# Patient Record
Sex: Female | Born: 1950 | Race: Black or African American | Hispanic: No | Smoking: Never smoker
Health system: Southern US, Community
[De-identification: ages and names within clinical notes are randomized; demographics above are authoritative.]

## PROBLEM LIST (undated history)

## (undated) DIAGNOSIS — M6740 Ganglion, unspecified site: Secondary | ICD-10-CM

## (undated) DIAGNOSIS — T7840XA Allergy, unspecified, initial encounter: Secondary | ICD-10-CM

## (undated) DIAGNOSIS — E559 Vitamin D deficiency, unspecified: Secondary | ICD-10-CM

## (undated) HISTORY — DX: Ganglion, unspecified site: M67.40

## (undated) HISTORY — PX: PARTIAL HYSTERECTOMY: SHX80

## (undated) HISTORY — DX: Allergy, unspecified, initial encounter: T78.40XA

## (undated) HISTORY — PX: APPENDECTOMY: SHX54

## (undated) HISTORY — DX: Vitamin D deficiency, unspecified: E55.9

---

## 2021-11-29 LAB — TSH: TSH: 0.01 — AB (ref 0.41–5.90)

## 2021-12-12 LAB — COLOGUARD: COLOGUARD: NEGATIVE

## 2022-01-04 ENCOUNTER — Encounter: Payer: Self-pay | Admitting: Nurse Practitioner

## 2022-01-04 ENCOUNTER — Ambulatory Visit: Payer: Medicare PPO | Admitting: Nurse Practitioner

## 2022-01-04 VITALS — BP 119/78 | HR 90 | Ht 64.0 in | Wt 173.0 lb

## 2022-01-04 DIAGNOSIS — E0590 Thyrotoxicosis, unspecified without thyrotoxic crisis or storm: Secondary | ICD-10-CM

## 2022-01-04 DIAGNOSIS — R7989 Other specified abnormal findings of blood chemistry: Secondary | ICD-10-CM | POA: Diagnosis not present

## 2022-01-04 NOTE — Progress Notes (Signed)
? ? ? 01/04/2022   ? ? ?Endocrinology Consult Note  ? ? ?Subjective:  ? ? Patient ID: Gwendolyn Gordon, female    DOB: 1951/07/03, PCP Lenoria ChimeWhite, Valerie A, FNP. ? ? ?Past Medical History:  ?Diagnosis Date  ? Allergies   ? Ganglion cyst   ? Vitamin D deficiency   ? ? ?Past Surgical History:  ?Procedure Laterality Date  ? APPENDECTOMY    ? PARTIAL HYSTERECTOMY    ? ? ?Social History  ? ?Socioeconomic History  ? Marital status: Widowed  ?  Spouse name: Not on file  ? Number of children: Not on file  ? Years of education: Not on file  ? Highest education level: Not on file  ?Occupational History  ? Not on file  ?Tobacco Use  ? Smoking status: Never  ? Smokeless tobacco: Never  ?Vaping Use  ? Vaping Use: Never used  ?Substance and Sexual Activity  ? Alcohol use: Never  ? Drug use: Never  ? Sexual activity: Not on file  ?Other Topics Concern  ? Not on file  ?Social History Narrative  ? Not on file  ? ?Social Determinants of Health  ? ?Financial Resource Strain: Not on file  ?Food Insecurity: Not on file  ?Transportation Needs: Not on file  ?Physical Activity: Not on file  ?Stress: Not on file  ?Social Connections: Not on file  ? ? ?Family History  ?Problem Relation Age of Onset  ? Diabetes Mother   ? ? ?Outpatient Encounter Medications as of 01/04/2022  ?Medication Sig  ? cetirizine (ZYRTEC ALLERGY) 10 MG tablet daily as needed.  ? Cholecalciferol 50 MCG (2000 UT) CAPS daily.  ? ?No facility-administered encounter medications on file as of 01/04/2022.  ? ? ?ALLERGIES: ?No Known Allergies ? ?VACCINATION STATUS: ?Immunization History  ?Administered Date(s) Administered  ? Moderna Sars-Covid-2 Vaccination 10/12/2019, 11/09/2019  ? PFIZER(Purple Top)SARS-COV-2 Vaccination 08/05/2020  ? Research officer, trade unionfizer Covid-19 Vaccine Bivalent Booster 5932yrs & up 06/08/2021  ? ? ? ?HPI ? ?Gwendolyn ModyCarolyn M Ernest is 71 y.o. female who presents today with a medical history as above. she is being seen in consultation for hyperthyroidism requested by Lenoria ChimeWhite, Valerie  A, FNP.  She had her annual physical and thyroid labs were drawn and came back abnormal.  She does not report any specific symptoms of thyroid dysfunction other than difficulty losing weight, hair loss, and muscle aches.  her most recent thyroid labs revealed suppressed TSH of < 0.01 and normal Free T4 of 1.41 on 11/29/21.  She has never had any imaging of her thyroid in the past. ? ?she denies dysphagia, choking, shortness of breath, no recent voice change.  ?  ?she does family history of thyroid dysfunction in her daughter-hyperthyroidism, but denies family hx of thyroid cancer. she denies personal history of goiter. she is not on any anti-thyroid medications nor on any thyroid hormone supplements. Denies use of Biotin containing supplements.  she is willing to proceed with appropriate work up and therapy for thyrotoxicosis. ? ? ?Review of systems ? ?Constitutional: + Minimally fluctuating body weight, current Body mass index is 29.7 kg/m?., + fatigue, no subjective hyperthermia, no subjective hypothermia ?Eyes: no blurry vision, no xerophthalmia ?ENT: no sore throat, no nodules palpated in throat, no dysphagia/odynophagia, no hoarseness ?Cardiovascular: no chest pain, no shortness of breath, no palpitations, no leg swelling ?Respiratory: no cough, no shortness of breath ?Gastrointestinal: no nausea/vomiting/diarrhea ?Musculoskeletal: diffuse muscle/joint aches ?Skin: no rashes, no hyperemia, mild hair loss ?Neurological: no tremors, no numbness, no  tingling, no dizziness ?Psychiatric: no depression, no anxiety ? ? ?Objective:  ?  ?BP 119/78   Pulse 90   Ht 5\' 4"  (1.626 m)   Wt 173 lb (78.5 kg)   BMI 29.70 kg/m?   ?Wt Readings from Last 3 Encounters:  ?01/04/22 173 lb (78.5 kg)  ?  ? ?BP Readings from Last 3 Encounters:  ?01/04/22 119/78  ? ? ?                     ? ?Physical Exam- Limited ? ?Constitutional:  Body mass index is 29.7 kg/m?. , not in acute distress, normal state of mind ?Eyes:  EOMI, no  exophthalmos ?Neck: Supple ?Thyroid: Mild gross goiter, no palpable nodularity ?Cardiovascular: RRR, no murmurs, rubs, or gallops, no edema ?Respiratory: Adequate breathing efforts, no crackles, rales, rhonchi, or wheezing ?Musculoskeletal: no gross deformities, strength intact in all four extremities, no gross restriction of joint movements ?Skin:  no rashes, no hyperemia ?Neurological: no tremor with outstretched hands ? ? ?CMP  ?No results found for: NA, K, CL, CO2, GLUCOSE, BUN, CREATININE, CALCIUM, PROT, ALBUMIN, AST, ALT, ALKPHOS, BILITOT, GFRNONAA, GFRAA ? ? ?CBC ?No results found for: WBC, RBC, HGB, HCT, PLT, MCV, MCH, MCHC, RDW, LYMPHSABS, MONOABS, EOSABS, BASOSABS ? ? ?Diabetic Labs (most recent): ?No results found for: HGBA1C ? ?Lipid Panel  ?No results found for: CHOL, TRIG, HDL, CHOLHDL, VLDL, LDLCALC, LDLDIRECT, LABVLDL ? ? ?Lab Results  ?Component Value Date  ? TSH 0.01 (A) 11/29/2021  ?  ? ?TSH ?TSH ?Resulted: 11/29/21 0000  ?Result status: Final  ?Resulting lab: OTHER  ?Reference range: 0.41 - 5.90  ?Value: 0.01 Abnormal    ?Comment: TSH < 0.01; Free T4-1.41  ? ? ? ?Assessment & Plan:  ? ?1) Abnormal TSH ? ?she is being seen at a kind request of White, 07-21-1994, FNP. ? ?her history and most recent labs are reviewed, and she was examined clinically. Subjective and objective findings are inconsistent with thyrotoxicosis from primary hyperthyroidism. However, more information is needed to help identify any disorder.  The potential risks of untreated thyrotoxicosis and the need for definitive therapy have been discussed in detail with her, and she agrees to proceed with diagnostic workup and treatment plan. ?  ?I will repeat full profile thyroid function tests today, including antibody testing to assess for autoimmune cause.  Will also order thyroid ultrasound given mild goiter to assess thyroid anatomy.  ? ?Given essentially normal exam, not overly concerned with hyperthyroidism at this time,  therefore will hold off on uptake and scan.  I did not initiate any new prescriptions at today's visit. ?  ? ?she will return in 2 weeks for treatment decision. ? ? ? ? ?-Patient is advised to maintain close follow up with Raenette Rover, FNP for primary care needs. ? ? ?- Time spent with the patient: 45 minutes, of which >50% was spent in obtaining information about her symptoms, reviewing her previous labs, evaluations, and treatments, counseling her about her hyperthyroidism , and developing a plan to confirm the diagnosis and long term treatment as necessary. Please refer to "Patient Self Inventory" in the Media tab for reviewed elements of pertinent patient history. ? ?Lenoria Chime participated in the discussions, expressed understanding, and voiced agreement with the above plans.  All questions were answered to her satisfaction. she is encouraged to contact clinic should she have any questions or concerns prior to her return visit.  ? ?Follow up plan: ?Return  in about 2 weeks (around 01/18/2022) for Thyroid follow up, Previsit labs. ? ? ?Thank you for involving me in the care of this pleasant patient, and I will continue to update you with her progress. ? ? ? ?Ronny Bacon, FNP-BC ?[facility] ?[address] ?[place], [place] [address] ?Phone: [phone] ?Fax: [phone] ? ?01/04/2022, 2:54 PM ? ?

## 2022-01-05 NOTE — Progress Notes (Signed)
I had visit with patient yesterday and wasn't too concerned about hyperthyroidism based on exam and previous blood work.  I had ordered for her to have ultrasound and held off on the uptake and scan until I got the results back.  Her bloodwork does show that her thyroid is producing too much thyroid hormone, therefore I do think we need to schedule the uptake and scan.  We can do that one instead of the ultrasound for now (no need to do both just yet).  Her antibody testing was positive indicating this is an autoimmune thyroid problem.  I will enter the order for the uptake and scan, just tell the patient we are canceling the ultrasound and ordering something different.

## 2022-01-05 NOTE — Addendum Note (Signed)
Addended by: Dani Gobble on: 01/05/2022 08:05 AM ? ? Modules accepted: Orders ? ?

## 2022-01-06 ENCOUNTER — Telehealth: Payer: Self-pay | Admitting: Nurse Practitioner

## 2022-01-06 LAB — T3, FREE: T3, Free: 5.5 pg/mL — ABNORMAL HIGH (ref 2.0–4.4)

## 2022-01-06 LAB — T4, FREE: Free T4: 1.75 ng/dL (ref 0.82–1.77)

## 2022-01-06 LAB — TSH: TSH: <0.005 u[IU]/mL — ABNORMAL LOW (ref 0.450–4.500)

## 2022-01-06 LAB — THYROID PEROXIDASE ANTIBODY: Thyroperoxidase Ab SerPl-aCnc: 245 IU/mL — ABNORMAL HIGH (ref 0–34)

## 2022-01-06 LAB — THYROGLOBULIN ANTIBODY: Thyroglobulin Antibody: <1 IU/mL (ref 0.0–0.9)

## 2022-01-06 NOTE — Telephone Encounter (Signed)
Pt said she had a missed call from someone to set up her uptake and scan and she called the number back which is [phone], and was told they only schedule for [facility] not [facility] and pt would like this to be done at [facility] if possible. Please advise  ?

## 2022-01-06 NOTE — Telephone Encounter (Signed)
Left a message requesting pt return call to the office. ?

## 2022-01-07 NOTE — Telephone Encounter (Signed)
Pt returning your call

## 2022-01-07 NOTE — Telephone Encounter (Signed)
Left a message requesting pt return call to the office. ?

## 2022-01-07 NOTE — Telephone Encounter (Signed)
Spoke with pt, gave her the telephone number to [facility] so she can schedule her uptake & scan.  ?

## 2022-01-13 ENCOUNTER — Encounter (HOSPITAL_COMMUNITY)
Admission: RE | Admit: 2022-01-13 | Discharge: 2022-01-13 | Disposition: A | Discharge status: Home / Self Care | Payer: Medicare PPO | Source: Ambulatory Visit | Attending: Nurse Practitioner | Admitting: Nurse Practitioner

## 2022-01-13 ENCOUNTER — Ambulatory Visit (HOSPITAL_COMMUNITY)
Admission: RE | Admit: 2022-01-13 | Discharge: 2022-01-13 | Disposition: A | Discharge status: Home / Self Care | Payer: Medicare PPO | Source: Ambulatory Visit | Attending: Nurse Practitioner | Admitting: Nurse Practitioner

## 2022-01-13 ENCOUNTER — Encounter (HOSPITAL_COMMUNITY): Payer: Self-pay

## 2022-01-13 DIAGNOSIS — E042 Nontoxic multinodular goiter: Secondary | ICD-10-CM | POA: Diagnosis not present

## 2022-01-13 DIAGNOSIS — E0590 Thyrotoxicosis, unspecified without thyrotoxic crisis or storm: Secondary | ICD-10-CM | POA: Diagnosis present

## 2022-01-13 DIAGNOSIS — R7989 Other specified abnormal findings of blood chemistry: Secondary | ICD-10-CM | POA: Insufficient documentation

## 2022-01-13 MED ORDER — SODIUM IODIDE I-123 7.4 MBQ CAPS
300.0000 | ORAL_CAPSULE | Freq: Once | ORAL | Status: AC
  Administered 2022-01-13: 300 via ORAL

## 2022-01-14 ENCOUNTER — Encounter (HOSPITAL_COMMUNITY)
Admission: RE | Admit: 2022-01-14 | Discharge: 2022-01-14 | Disposition: A | Discharge status: Home / Self Care | Payer: Medicare PPO | Source: Ambulatory Visit | Attending: Nurse Practitioner | Admitting: Nurse Practitioner

## 2022-01-19 ENCOUNTER — Encounter: Payer: Self-pay | Admitting: Nurse Practitioner

## 2022-01-19 ENCOUNTER — Other Ambulatory Visit: Payer: Self-pay | Admitting: Nurse Practitioner

## 2022-01-19 ENCOUNTER — Ambulatory Visit: Payer: Medicare PPO | Admitting: Nurse Practitioner

## 2022-01-19 VITALS — BP 111/72 | HR 109 | Ht 64.0 in | Wt 170.0 lb

## 2022-01-19 DIAGNOSIS — E0590 Thyrotoxicosis, unspecified without thyrotoxic crisis or storm: Secondary | ICD-10-CM | POA: Diagnosis not present

## 2022-01-19 DIAGNOSIS — E0500 Thyrotoxicosis with diffuse goiter without thyrotoxic crisis or storm: Secondary | ICD-10-CM | POA: Diagnosis not present

## 2022-01-19 MED ORDER — PROPRANOLOL HCL 20 MG PO TABS: 20.0000 mg | ORAL_TABLET | Freq: Two times a day (BID) | ORAL | 0 refills | Status: DC

## 2022-01-19 NOTE — Progress Notes (Signed)
? ? ? 01/19/2022   ? ? ?Endocrinology Follow Up Note  ? ? ?Subjective:  ? ? Patient ID: Gwendolyn Gordon, female    DOB: 18-Sep-1950, PCP Gwendolyn Bob, FNP. ? ? ?Past Medical History:  ?Diagnosis Date  ? Allergies   ? Ganglion cyst   ? Vitamin D deficiency   ? ? ?Past Surgical History:  ?Procedure Laterality Date  ? APPENDECTOMY    ? PARTIAL HYSTERECTOMY    ? ? ?Social History  ? ?Socioeconomic History  ? Marital status: Widowed  ?  Spouse name: Not on file  ? Number of children: Not on file  ? Years of education: Not on file  ? Highest education level: Not on file  ?Occupational History  ? Not on file  ?Tobacco Use  ? Smoking status: Never  ? Smokeless tobacco: Never  ?Vaping Use  ? Vaping Use: Never used  ?Substance and Sexual Activity  ? Alcohol use: Never  ? Drug use: Never  ? Sexual activity: Not on file  ?Other Topics Concern  ? Not on file  ?Social History Narrative  ? Not on file  ? ?Social Determinants of Health  ? ?Financial Resource Strain: Not on file  ?Food Insecurity: Not on file  ?Transportation Needs: Not on file  ?Physical Activity: Not on file  ?Stress: Not on file  ?Social Connections: Not on file  ? ? ?Family History  ?Problem Relation Age of Onset  ? Diabetes Mother   ? ? ?Outpatient Encounter Medications as of 01/19/2022  ?Medication Sig  ? propranolol (INDERAL) 20 MG tablet Take 1 tablet (20 mg total) by mouth 2 (two) times daily.  ? cetirizine (ZYRTEC ALLERGY) 10 MG tablet daily as needed.  ? Cholecalciferol 50 MCG (2000 UT) CAPS daily.  ? ?No facility-administered encounter medications on file as of 01/19/2022.  ? ? ?ALLERGIES: ?No Known Allergies ? ?VACCINATION STATUS: ?Immunization History  ?Administered Date(s) Administered  ? Moderna Sars-Covid-2 Vaccination 10/12/2019, 11/09/2019  ? PFIZER(Purple Top)SARS-COV-2 Vaccination 08/05/2020  ? Pension scheme manager 8yrs & up 06/08/2021  ? ? ? ?HPI ? ?Gwendolyn Gordon is 71 y.o. female who presents today with a medical  history as above. she is being seen in follow up after being seen in consultation for hyperthyroidism requested by Gwendolyn Bob, FNP.  She had her annual physical and thyroid labs were drawn and came back abnormal.  She does not report any specific symptoms of thyroid dysfunction other than difficulty losing weight, hair loss, and muscle aches.  her most recent thyroid labs revealed suppressed TSH of < 0.01 and normal Free T4 of 1.41 on 11/29/21.  She has never had any imaging of her thyroid in the past. ? ?she denies dysphagia, choking, shortness of breath, no recent voice change.  ?  ?she does family history of thyroid dysfunction in her daughter-hyperthyroidism, but denies family hx of thyroid cancer. she denies personal history of goiter. she is not on any anti-thyroid medications nor on any thyroid hormone supplements. Denies use of Biotin containing supplements.  she is willing to proceed with appropriate work up and therapy for thyrotoxicosis. ? ? ?Review of systems ? ?Constitutional: + Minimally fluctuating body weight, current Body mass index is 29.18 kg/m?., + fatigue, + subjective hyperthermia, no subjective hypothermia ?Eyes: no blurry vision, no xerophthalmia ?ENT: no sore throat, no nodules palpated in throat, no dysphagia/odynophagia, no hoarseness ?Cardiovascular: no chest pain, no shortness of breath, no palpitations, no leg swelling ?Respiratory: no  cough, no shortness of breath ?Gastrointestinal: no nausea/vomiting/diarrhea ?Musculoskeletal: diffuse muscle/joint aches ?Skin: no rashes, no hyperemia, mild hair loss ?Neurological: no tremors, no numbness, no tingling, no dizziness ?Psychiatric: no depression, + anxiety ? ? ?Objective:  ?  ?BP 111/72   Pulse (!) 109   Ht 5\' 4"  (1.626 m)   Wt 170 lb (77.1 kg)   BMI 29.18 kg/m?   ?Wt Readings from Last 3 Encounters:  ?01/19/22 170 lb (77.1 kg)  ?01/04/22 173 lb (78.5 kg)  ?  ? ?BP Readings from Last 3 Encounters:  ?01/19/22 111/72  ?01/04/22  119/78  ? ? ?                     ? ?Physical Exam- Limited ? ?Constitutional:  Body mass index is 29.18 kg/m?. , not in acute distress, normal state of mind ?Eyes:  EOMI, no exophthalmos ?Neck: Supple ?Thyroid: Mild gross goiter, no palpable nodularity ?Cardiovascular: tachycardic, no murmurs, rubs, or gallops, no edema ?Respiratory: Adequate breathing efforts, no crackles, rales, rhonchi, or wheezing ?Musculoskeletal: no gross deformities, strength intact in all four extremities, no gross restriction of joint movements ?Skin:  no rashes, no hyperemia ?Neurological: no tremor with outstretched hands ? ? ?CMP  ?No results found for: NA, K, CL, CO2, GLUCOSE, BUN, CREATININE, CALCIUM, PROT, ALBUMIN, AST, ALT, ALKPHOS, BILITOT, GFRNONAA, GFRAA ? ? ?CBC ?No results found for: WBC, RBC, HGB, HCT, PLT, MCV, MCH, MCHC, RDW, LYMPHSABS, MONOABS, EOSABS, BASOSABS ? ? ?Diabetic Labs (most recent): ?No results found for: HGBA1C ? ?Lipid Panel  ?No results found for: CHOL, TRIG, HDL, CHOLHDL, VLDL, LDLCALC, LDLDIRECT, LABVLDL ? ? ?Lab Results  ?Component Value Date  ? TSH <0.005 (L) 01/04/2022  ? TSH 0.01 (A) 11/29/2021  ? FREET4 1.75 01/04/2022  ?  ? ?TSH ?TSH ?Resulted: 11/29/21 0000  ?Result status: Final  ?Resulting lab: OTHER  ?Reference range: 0.41 - 5.90  ?Value: 0.01 Abnormal    ?Comment: TSH < 0.01; Free T4-1.41  ? ? Latest Reference Range & Units 11/29/21 00:00 01/04/22 14:57  ?TSH 0.450 - 4.500 uIU/mL 0.01 ! (E) <0.005 (L)  ?Triiodothyronine,Free,Serum 2.0 - 4.4 pg/mL  5.5 (H)  ?T4,Free(Direct) 0.82 - 1.77 ng/dL  1.75  ?Thyroperoxidase Ab SerPl-aCnc 0 - 34 IU/mL  245 (H)  ?Thyroglobulin Antibody 0.0 - 0.9 IU/mL  <1.0  ?!: Data is abnormal ?(L): Data is abnormally low ?(H): Data is abnormally high ?(E): External lab result ? ?Uptake and Scan from 01/14/22 ?CLINICAL DATA:  Symptomatic hyperthyroidism, TSH < 0.005 ?  ?EXAM: ?THYROID SCAN AND UPTAKE - 4 AND 24 HOURS ?  ?TECHNIQUE: ?Following oral administration of I-123  capsule, anterior planar ?imaging was acquired at 24 hours. Thyroid uptake was calculated with ?a thyroid probe at 4-6 hours and 24 hours. ?  ?RADIOPHARMACEUTICALS:  300 uCi I-123 sodium iodide p.o. ?  ?COMPARISON:  None ?  ?FINDINGS: ?Small foci of increased and decreased tracer uptake in both thyroid ?lobes consistent with a multinodular thyroid gland. ?  ?4 hour I-123 uptake = 21.8% (normal 5-20%) ?  ?24 hour I-123 uptake = 36.1% (normal 10-30%) ?  ?IMPRESSION: ?Elevated 4 hour and 24 hour radio iodine uptakes consistent with ?hyperthyroidism with multinodular thyroid gland on scintigraphy. ?  ?  ?Electronically Signed ?  By: Lavonia Dana M.D. ?  On: 01/14/2022 10:19 ?Assessment & Plan:  ? ?1) Hyperthyroidism r/t Graves disease ? ?she is being seen at a kind request of Gwendolyn Bob, FNP. ? 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  Her uptake and scan was elevated further supporting the diagnosis.  She did have some areas of increased and decreased tracer uptake consistent with MNG.  Treatment for toxic thyroid nodule is the same as hyperthyroidism- RAI ablation.  We discussed this today and she agrees to proceed.  Will order to be done ASAP to prevent her needing Methimazole and thereby delaying her definitive treatment. ? ?Will repeat TFTs about 6 weeks post procedure to monitor progress and determine timing of starting thyroid hormone replacement therapy. ? ?I did start her on Propanolol 20 mg po twice daily for symptom management. ?  ? ? ? ? ? ? ?-Patient is advised to maintain close follow up with Gwendolyn Bob, FNP for primary care needs. ? ? ?I spent 32 minutes in the care of the patient today including review of labs from Thyroid Function, CMP, and other relevant labs ; imaging/biopsy records (current and previous including abstractions from other facilities); face-to-face time discussing  her lab results and symptoms, medications  doses, her options of short and long term treatment based on the latest standards of care / guidelines;   and documenting the encounter. ? ?Gwendolyn Gordon  participated in the discussions, expressed [place]

## 2022-01-19 NOTE — Patient Instructions (Signed)
Radioiodine (I-131) Therapy for Hyperthyroidism ?Radioiodine (I-131) therapy is a treatment for an overactive thyroid gland (hyperthyroidism). The thyroid is a gland in the neck that uses iodine to help control how the body uses food (metabolism). This treatment involves swallowing a pill or liquid that contains I-131. I-131 is manufactured (synthetic) iodine that gives off radiation. After it is swallowed, the I-131 will be absorbed by the thyroid gland over the next few months. It will destroy thyroid cells and reverse hyperthyroidism. ?Tell a health care provider about: ?Any allergies you have. ?All medicines you are taking, including vitamins, herbs, eye drops, creams, and over-the-counter medicines. ?Any blood disorders you have. ?Any surgeries you have had. ?Any medical conditions you have. ?Whether you are pregnant, may be pregnant, or have gone through menopause, if this applies. ?Whether you currently have children. ?Whether you are breastfeeding. ?Whether you plan to have children in the next 2 years. ?Any contact you have with children or pregnant women. ?Your travel plans for the next 3 months. ?Whether you pass through radiation detectors for work or travel. ?What are the risks? ?Generally, this is a safe procedure. However, problems may occur, including: ?Damage to other structures or organs, such as the salivary glands. This could lead to dry mouth and loss of taste. ?Low sperm count, if this applies. This may lead to temporary infertility. ?Sore throat or neck pain. This is temporary. ?Slightly increased risk of thyroid cancer. ?Nausea or vomiting. ?What happens before the procedure? ?Staying hydrated ?Follow instructions from your health care provider about hydration, which may include: ?Up to 2 hours before the procedure - you may continue to drink clear liquids, such as water, clear fruit juice, black coffee, and plain tea. ?Eating and drinking restrictions ?Follow instructions from your health  care provider about eating and drinking restrictions. ?Follow a low-iodine diet as told by your health care provider. Check ingredients on packaged foods and drinks because there are foods that you will need to avoid while on the low-iodine diet: ?Avoid iodized table salt and foods that have iodized salt. ?Avoid seafood, seaweed, soybeans, and soy products. ?Avoid dairy products and eggs. ?Avoid the food dye Red No. 3 because it has iodine. ?Medicines ? ?Ask your health care provider about: ?Changing or stopping your regular medicines. This is especially important if you are taking diabetes medicines, blood thinners, or thyroid medicines. ?Taking over-the-counter medicines, vitamins, herbs, and supplements. ?General instructions ?Women may be asked to take a pregnancy test. ?Women who are breastfeeding should: ?Plan to stop at least 6 weeks before the procedure. ?Not go back to breastfeeding after the procedure until their health care provider approves. ?Plan to avoid contact with other people for 1 week after your treatment. Avoiding contact with children and pregnant women is especially important. To do this, plan to stay home from work, arrange child care, and sleep alone, if these things apply to you. ?Plan to drive yourself home after treatment. Do not take public transportation. If you need someone to drive you home, sit as far away from the driver as possible. ?What happens during the procedure? ?You will be given a dose of I-131 to swallow. It may be a pill or a liquid. ?Your thyroid gland will absorb the I-131 over the next 3 months. The treatment process will be complete in about 6 months. ?What happens after the procedure? ?You may need to stay in the hospital for 24 hours after your treatment. This depends on the requirements in your state. ?Follow instructions   from your health care provider about: How to take care of yourself after the procedure. How to protect others from exposure to radiation as it  leaves your body. Summary Radioiodine (I-131) therapy is a treatment for an overactive thyroid gland (hyperthyroidism). This treatment involves swallowing a pill or liquid that contains I-131. I-131 is manufactured iodine that gives off radiation. Your thyroid gland will absorb the I-131 over the next 3 months. The I-131 destroys thyroid cells and reverses hyperthyroidism. Follow instructions from your health care provider about how to take care of yourself and how to protect other people from exposure to radiation after the procedure. This information is not intended to replace advice given to you by your health care provider. Make sure you discuss any questions you have with your health care provider. Document Revised: 09/03/2021 Document Reviewed: 10/04/2018 Elsevier Patient Education  2023 Elsevier Inc.  

## 2022-01-21 NOTE — Written Directive (Addendum)
MOLECULAR IMAGING AND THERAPEUTICS WRITTEN DIRECTIVE   PATIENT NAME: Gwendolyn Gordon  PT DOB:   1951/03/04                                              MRN: [id number]  ---------------------------------------------------------------------------------------------------------------------   I-131 WHOLE THYROID THERAPY (NON-CANCER)    RADIOPHARMACEUTICAL:   Iodine-131 Capsule    PRESCRIBED DOSE FOR ADMINISTRATION: 30 mCi   ROUTE OFADMINISTRATION: PO   DIAGNOSIS:  Toxic multinodular goiter   REFERRING PHYSICIAN: Dr. Dede Query   TSH:    Lab Results  Component Value Date   TSH <0.005 (L) 01/04/2022   TSH 0.01 (A) 11/29/2021     PRIOR I-131 THERAPY (Date and Dose): N/A   PRIOR RADIOLOGY EXAMS (Results and Date): NM THYROID MULT UPTAKE W/IMAGING  Result Date: 01/14/2022 CLINICAL DATA:  Symptomatic hyperthyroidism, TSH < 0.005 EXAM: THYROID SCAN AND UPTAKE - 4 AND 24 HOURS TECHNIQUE: Following oral administration of I-123 capsule, anterior planar imaging was acquired at 24 hours. Thyroid uptake was calculated with a thyroid probe at 4-6 hours and 24 hours. RADIOPHARMACEUTICALS:  300 uCi I-123 sodium iodide p.o. COMPARISON:  None FINDINGS: Small foci of increased and decreased tracer uptake in both thyroid lobes consistent with a multinodular thyroid gland. 4 hour I-123 uptake = 21.8% (normal 5-20%) 24 hour I-123 uptake = 36.1% (normal 10-30%) IMPRESSION: Elevated 4 hour and 24 hour radio iodine uptakes consistent with hyperthyroidism with multinodular thyroid gland on scintigraphy. Electronically Signed   By: Ulyses Southward M.D.   On: 01/14/2022 10:19      ADDITIONAL PHYSICIAN COMMENTS/NOTES  Multinodular gland with depressed TSH mildly increased I 131 uptake suggest toxic MNG vs nodular graves disease. Some hair loss and hyperthermia.  Minimal symptoms. AUTHORIZED USER SIGNATURE & TIME STAMP: Patriciaann Clan, MD   01/21/22    9:55 AM

## 2022-01-26 ENCOUNTER — Encounter (HOSPITAL_COMMUNITY): Payer: Self-pay

## 2022-01-26 ENCOUNTER — Encounter (HOSPITAL_COMMUNITY)
Admission: RE | Admit: 2022-01-26 | Discharge: 2022-01-26 | Disposition: A | Discharge status: Home / Self Care | Payer: Medicare PPO | Source: Ambulatory Visit | Attending: Nurse Practitioner | Admitting: Nurse Practitioner

## 2022-01-27 ENCOUNTER — Encounter (HOSPITAL_COMMUNITY): Payer: Self-pay

## 2022-01-27 ENCOUNTER — Encounter (HOSPITAL_COMMUNITY): Payer: Medicare PPO

## 2022-01-28 ENCOUNTER — Telehealth: Payer: Self-pay | Admitting: Nurse Practitioner

## 2022-01-28 MED ORDER — PROPRANOLOL HCL 20 MG PO TABS: 20.0000 mg | ORAL_TABLET | Freq: Two times a day (BID) | ORAL | 0 refills | Status: DC

## 2022-01-28 MED ORDER — METHIMAZOLE 5 MG PO TABS: 5.0000 mg | ORAL_TABLET | Freq: Every day | ORAL | 3 refills | Status: DC

## 2022-01-28 NOTE — Telephone Encounter (Signed)
Sure!  I sent in a refill for Propanolol as well

## 2022-01-28 NOTE — Telephone Encounter (Signed)
She will be out of her propanolol before she sees you again she said. Can you call that in?

## 2022-01-28 NOTE — Telephone Encounter (Signed)
Patient left a VM stating she her iodine therapy was canceled since she is going out of the country. She said nobody from here has advised her on what she should do as far as medications etc. Patient is asking for a call back with instructions as far as her medication so she is comfortable traveling, she will be gone around 12 days.

## 2022-01-28 NOTE — Telephone Encounter (Signed)
Yes, Dr. Thornton Papas called me the other day and filled me in on the plan to hold off until she returns from her trip.  In the meantime, to prevent any problems, we will need to start low-dose Methimazole 5 mg po daily.  She can continue the Propanolol 20 mg po twice daily for symptom management.  I have already sent in Rx for Methimazole.

## 2022-01-28 NOTE — Addendum Note (Signed)
Addended by: Brita Romp on: 01/28/2022 10:52 AM   Modules accepted: Orders

## 2022-02-24 ENCOUNTER — Telehealth: Payer: Self-pay | Admitting: Nurse Practitioner

## 2022-02-24 NOTE — Telephone Encounter (Signed)
Informed patient

## 2022-02-24 NOTE — Telephone Encounter (Signed)
Pt called and states she has RAI on Monday and is having nausea and was instructed to stop taking all medication, she states ginger is not helping and would like to know if anything else you could recommend.  Patient also asked if she is supposed to start her medication back on Monday after RAI and I informed her they would give her all instructions on Monday.   [phone]

## 2022-02-24 NOTE — Telephone Encounter (Signed)
She can try peppermints.  They sometimes help settle the stomach.  And saltines.

## 2022-02-28 ENCOUNTER — Other Ambulatory Visit: Payer: Self-pay | Admitting: Nurse Practitioner

## 2022-02-28 ENCOUNTER — Encounter (HOSPITAL_COMMUNITY)
Admission: RE | Admit: 2022-02-28 | Discharge: 2022-02-28 | Disposition: A | Discharge status: Home / Self Care | Payer: Medicare PPO | Source: Ambulatory Visit | Attending: Nurse Practitioner | Admitting: Nurse Practitioner

## 2022-02-28 DIAGNOSIS — E0500 Thyrotoxicosis with diffuse goiter without thyrotoxic crisis or storm: Secondary | ICD-10-CM | POA: Diagnosis present

## 2022-02-28 DIAGNOSIS — E0590 Thyrotoxicosis, unspecified without thyrotoxic crisis or storm: Secondary | ICD-10-CM | POA: Insufficient documentation

## 2022-02-28 MED ORDER — SODIUM IODIDE I 131 CAPSULE
30.0000 | Freq: Once | INTRAVENOUS | Status: AC | PRN
  Administered 2022-02-28: 30 via ORAL

## 2022-03-09 ENCOUNTER — Telehealth: Payer: Self-pay | Admitting: Nurse Practitioner

## 2022-03-09 MED ORDER — PREDNISONE 10 MG PO TABS: 10.0000 mg | ORAL_TABLET | Freq: Every day | ORAL | 0 refills | Status: DC

## 2022-03-09 NOTE — Telephone Encounter (Signed)
Pt.notified

## 2022-03-09 NOTE — Telephone Encounter (Signed)
Her symptoms are likely related to the ablation, which can cause temporary worsening of her symptoms.  I will order for her to have a short course of oral steroids to help.  She wont need to take the Methimazole moving forward.  She can continue to take the propanolol until she sees me again.

## 2022-03-09 NOTE — Telephone Encounter (Signed)
Pt said she had her RAI on 6/26 and was told to not take her medication for 7 days. What would you like for her to do? Also, she said it is very hard to swallow, please advise.

## 2022-03-21 ENCOUNTER — Ambulatory Visit: Payer: Medicare PPO | Admitting: Nurse Practitioner

## 2022-04-07 LAB — T4, FREE: Free T4: 0.87 ng/dL (ref 0.82–1.77)

## 2022-04-07 LAB — T3, FREE: T3, Free: 2 pg/mL (ref 2.0–4.4)

## 2022-04-07 LAB — TSH: TSH: 0.085 u[IU]/mL — ABNORMAL LOW (ref 0.450–4.500)

## 2022-04-12 ENCOUNTER — Ambulatory Visit: Payer: Medicare PPO | Admitting: Nurse Practitioner

## 2022-04-12 ENCOUNTER — Encounter: Payer: Self-pay | Admitting: Nurse Practitioner

## 2022-04-12 VITALS — BP 115/74 | HR 74 | Ht 64.0 in | Wt 172.0 lb

## 2022-04-12 DIAGNOSIS — E890 Postprocedural hypothyroidism: Secondary | ICD-10-CM | POA: Diagnosis not present

## 2022-04-12 MED ORDER — LEVOTHYROXINE SODIUM 25 MCG PO TABS: 25.0000 ug | ORAL_TABLET | Freq: Every day | ORAL | 3 refills | Status: DC

## 2022-04-12 NOTE — Patient Instructions (Signed)

## 2022-04-12 NOTE — Progress Notes (Signed)
04/12/2022     Endocrinology Follow Up Note    Subjective:    Patient ID: Gwendolyn Gordon, female    DOB: February 15, 1951, PCP Ocie Bob, FNP.   Past Medical History:  Diagnosis Date   Allergies    Ganglion cyst    Vitamin D deficiency     Past Surgical History:  Procedure Laterality Date   APPENDECTOMY     PARTIAL HYSTERECTOMY      Social History   Socioeconomic History   Marital status: Widowed    Spouse name: Not on file   Number of children: Not on file   Years of education: Not on file   Highest education level: Not on file  Occupational History   Not on file  Tobacco Use   Smoking status: Never   Smokeless tobacco: Never  Vaping Use   Vaping Use: Never used  Substance and Sexual Activity   Alcohol use: Never   Drug use: Never   Sexual activity: Not on file  Other Topics Concern   Not on file  Social History Narrative   Not on file   Social Determinants of Health   Financial Resource Strain: Not on file  Food Insecurity: Not on file  Transportation Needs: Not on file  Physical Activity: Not on file  Stress: Not on file  Social Connections: Not on file    Family History  Problem Relation Age of Onset   Diabetes Mother     Outpatient Encounter Medications as of 04/12/2022  Medication Sig   levothyroxine (SYNTHROID) 25 MCG tablet Take 1 tablet (25 mcg total) by mouth daily.   cetirizine (ZYRTEC ALLERGY) 10 MG tablet daily as needed.   Cholecalciferol 50 MCG (2000 UT) CAPS daily.   propranolol (INDERAL) 20 MG tablet TAKE 1 TABLET(20 MG) BY MOUTH TWICE DAILY   [DISCONTINUED] methimazole (TAPAZOLE) 5 MG tablet Take 1 tablet (5 mg total) by mouth daily.   [DISCONTINUED] predniSONE (DELTASONE) 10 MG tablet Take 1 tablet (10 mg total) by mouth daily with breakfast.   No facility-administered encounter medications on file as of 04/12/2022.    ALLERGIES: No Known Allergies  VACCINATION STATUS: Immunization History  Administered Date(s)  Administered   Moderna Sars-Covid-2 Vaccination 10/12/2019, 11/09/2019   PFIZER(Purple Top)SARS-COV-2 Vaccination 08/05/2020   Pfizer Covid-19 Vaccine Bivalent Booster 52yrs & up 06/08/2021     HPI  Gwendolyn Gordon is 71 y.o. female who presents today with a medical history as above. she is being seen in follow up after being seen in consultation for hyperthyroidism requested by Ocie Bob, FNP.  She had her annual physical and thyroid labs were drawn and came back abnormal.  She does not report any specific symptoms of thyroid dysfunction other than difficulty losing weight, hair loss, and muscle aches.  her most recent thyroid labs revealed suppressed TSH of < 0.01 and normal Free T4 of 1.41 on 11/29/21.  She has never had any imaging of her thyroid in the past.  she denies dysphagia, choking, shortness of breath, no recent voice change.    she does family history of thyroid dysfunction in her daughter-hyperthyroidism, but denies family hx of thyroid cancer. she denies personal history of goiter. she is not on any anti-thyroid medications nor on any thyroid hormone supplements. Denies use of Biotin containing supplements.  she is willing to proceed with appropriate work up and therapy for thyrotoxicosis.   She is S/p RAI ablation on 02/28/22.    Review  of systems  Constitutional: + Minimally fluctuating body weight, current Body mass index is 29.52 kg/m., + fatigue, + subjective hyperthermia-improving, no subjective hypothermia Eyes: no blurry vision, no xerophthalmia ENT: no sore throat, no nodules palpated in throat, no dysphagia/odynophagia, no hoarseness Cardiovascular: no chest pain, no shortness of breath, no palpitations, no leg swelling Respiratory: no cough, no shortness of breath Gastrointestinal: no nausea/vomiting/diarrhea Musculoskeletal: diffuse muscle/joint aches Skin: no rashes, no hyperemia, mild hair loss-improving Neurological: no tremors, no numbness, no  tingling, no dizziness Psychiatric: no depression, + anxiety-improving   Objective:    BP 115/74   Pulse 74   Ht 5\' 4"  (1.626 m)   Wt 172 lb (78 kg)   BMI 29.52 kg/m   Wt Readings from Last 3 Encounters:  04/12/22 172 lb (78 kg)  01/19/22 170 lb (77.1 kg)  01/04/22 173 lb (78.5 kg)     BP Readings from Last 3 Encounters:  04/12/22 115/74  01/19/22 111/72  01/04/22 119/78       Physical Exam- Limited  Constitutional:  Body mass index is 29.52 kg/m. , not in acute distress, normal state of mind Eyes:  EOMI, no exophthalmos Neck: Supple Cardiovascular: RRR, no murmurs, rubs, or gallops, no edema Respiratory: Adequate breathing efforts, no crackles, rales, rhonchi, or wheezing Musculoskeletal: no gross deformities, strength intact in all four extremities, no gross restriction of joint movements Skin:  no rashes, no hyperemia Neurological: no tremor with outstretched hands   CMP  No results found for: "NA", "K", "CL", "CO2", "GLUCOSE", "BUN", "CREATININE", "CALCIUM", "PROT", "ALBUMIN", "AST", "ALT", "ALKPHOS", "BILITOT", "GFRNONAA", "GFRAA"   CBC No results found for: "WBC", "RBC", "HGB", "HCT", "PLT", "MCV", "MCH", "MCHC", "RDW", "LYMPHSABS", "MONOABS", "EOSABS", "BASOSABS"   Diabetic Labs (most recent): No results found for: "HGBA1C", "MICROALBUR"  Lipid Panel  No results found for: "CHOL", "TRIG", "HDL", "CHOLHDL", "VLDL", "LDLCALC", "LDLDIRECT", "LABVLDL"   Lab Results  Component Value Date   TSH 0.085 (L) 04/06/2022   TSH <0.005 (L) 01/04/2022   TSH 0.01 (A) 11/29/2021   FREET4 0.87 04/06/2022   FREET4 1.75 01/04/2022     TSH TSH Resulted: 11/29/21 0000  Result status: Final  Resulting lab: OTHER  Reference range: 0.41 - 5.90  Value: 0.01 Abnormal    Comment: TSH < 0.01; Free T4-1.41    Latest Reference Range & Units 11/29/21 00:00 01/04/22 14:57  TSH 0.450 - 4.500 uIU/mL 0.01 ! (E) <0.005 (L)  Triiodothyronine,Free,Serum 2.0 - 4.4 pg/mL  5.5  (H)  T4,Free(Direct) 0.82 - 1.77 ng/dL  03/06/22  Thyroperoxidase Ab SerPl-aCnc 0 - 34 IU/mL  245 (H)  Thyroglobulin Antibody 0.0 - 0.9 IU/mL  <1.0  !: Data is abnormal (L): Data is abnormally low (H): Data is abnormally high (E): External lab result  Uptake and Scan from 01/14/22 CLINICAL DATA:  Symptomatic hyperthyroidism, TSH < 0.005   EXAM: THYROID SCAN AND UPTAKE - 4 AND 24 HOURS   TECHNIQUE: Following oral administration of I-123 capsule, anterior planar imaging was acquired at 24 hours. Thyroid uptake was calculated with a thyroid probe at 4-6 hours and 24 hours.   RADIOPHARMACEUTICALS:  300 uCi I-123 sodium iodide p.o.   COMPARISON:  None   FINDINGS: Small foci of increased and decreased tracer uptake in both thyroid lobes consistent with a multinodular thyroid gland.   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     Electronically Signed   By: Ulyses Southward M.D.   On: 01/14/2022 10:19   Latest Reference Range & Units 11/29/21 00:00 01/04/22 14:57 04/06/22 13:01  TSH 0.450 - 4.500 uIU/mL 0.01 ! 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  She had RAI ablation on 02/28/22.  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  Her uptake and scan was elevated further supporting the diagnosis.  She  did have some areas of increased and decreased tracer uptake consistent with MNG.  Treatment for toxic thyroid nodule is the same as hyperthyroidism- RAI ablation.    Her repeat thyroid function tests are now consistent with under-active thyroid as a result of her RAI ablation.  Her TSH is still suppressed but is the last to change and her Free T4 level is also low indicating she is ready to start low dose thyroid hormone replacement therapy.   I discussed and initiated Levothyroxine 25 mcg po daily before breakfast.   - The correct intake of thyroid hormone (Levothyroxine, Synthroid), is on empty stomach first thing in the morning, with water, separated by at least 30 minutes from breakfast and other medications,  and separated by more than 4 hours from calcium, iron, multivitamins, acid reflux medications (PPIs).  - This medication is a life-long medication and will be needed to correct thyroid hormone imbalances for the rest of your life.  The dose may change from time to time, based on thyroid blood work.  - It is extremely important to be consistent taking this medication, near the same time each morning.  -AVOID TAKING PRODUCTS CONTAINING BIOTIN (commonly found in Hair, Skin, Nails vitamins) AS IT INTERFERES WITH THE VALIDITY OF THYROID FUNCTION BLOOD TESTS.          -Patient is advised to maintain close follow up with Lenoria Chime, FNP for primary care needs.    I spent 22 minutes in the care of the patient today including review of labs from Thyroid Function, CMP, and other relevant labs ; imaging/biopsy records (current and previous including abstractions from other facilities); face-to-face time discussing  her lab results and symptoms, medications doses, her options of short and long term treatment based on the latest standards of care / guidelines;   and documenting the encounter.  Kathlen Mody  participated in the discussions, expressed understanding, and voiced  agreement with the above plans.  All questions were answered to her satisfaction. she is encouraged to contact clinic should she have any questions or concerns prior to her return visit.   Follow up plan: Return in about 8 weeks (around 06/07/2022) for Thyroid follow up, Previsit labs.   Thank you for involving me in the care of this pleasant patient, and I will continue to update you with her progress.    Ronny Bacon, [facility] [address] [place], [place] [address] Phone: [phone] Fax: [phone]  04/12/2022, 2:55 PM

## 2022-05-25 ENCOUNTER — Telehealth: Payer: Self-pay | Admitting: Nurse Practitioner

## 2022-05-25 NOTE — Telephone Encounter (Signed)
New message   Patient C/o muscle ache back and legs   Taken Aleve

## 2022-05-26 ENCOUNTER — Other Ambulatory Visit: Payer: Self-pay | Admitting: Nurse Practitioner

## 2022-05-26 DIAGNOSIS — E890 Postprocedural hypothyroidism: Secondary | ICD-10-CM

## 2022-05-26 NOTE — Telephone Encounter (Signed)
Yes, please.  She needs to see her PCP

## 2022-05-26 NOTE — Telephone Encounter (Signed)
Talked with the patient and she has a OV with [facility] on October 3rd. She had an a order to have pre visit lab work done . If the thyroid is not on this , we may mail her the order or fax it to the [employer] that is inside Dr. Serita Grit office ( [place])

## 2022-05-31 LAB — TSH: TSH: 33.3 u[IU]/mL — ABNORMAL HIGH (ref 0.450–4.500)

## 2022-05-31 LAB — T4, FREE: Free T4: 0.55 ng/dL — ABNORMAL LOW (ref 0.82–1.77)

## 2022-05-31 MED ORDER — LEVOTHYROXINE SODIUM 50 MCG PO TABS: 50.0000 ug | ORAL_TABLET | Freq: Every day | ORAL | 0 refills | Status: DC

## 2022-05-31 NOTE — Progress Notes (Signed)
Her thyroid is continuing to respond to the ablation therapy and has continued to shut down, meaning she needs an increase in her Levothyroxine dose.  I will send in new prescription for the 50 mcg dose (she can start taking 2 of her 25 mcg tabs until she picks up new prescription).  Will need to recheck labs in about 6 weeks and follow up then.  We can push out her appointment until then.

## 2022-05-31 NOTE — Addendum Note (Signed)
Addended by: Brita Romp on: 05/31/2022 07:31 AM   Modules accepted: Orders

## 2022-06-05 ENCOUNTER — Other Ambulatory Visit: Payer: Self-pay | Admitting: Nurse Practitioner

## 2022-06-06 NOTE — Telephone Encounter (Signed)
Forwarding to Rayetta Pigg, NP for her to address. Not sue if she wants to continue filling or the patient's PCP. She did last right this medication for the patient and this was 02/28/2022 , and for a 90 supply , per patient request.

## 2022-06-07 ENCOUNTER — Ambulatory Visit: Payer: Medicare PPO | Admitting: Nurse Practitioner

## 2022-07-08 LAB — T4, FREE: Free T4: 0.78 ng/dL — ABNORMAL LOW (ref 0.82–1.77)

## 2022-07-08 LAB — TSH: TSH: 31.5 u[IU]/mL — ABNORMAL HIGH (ref 0.450–4.500)

## 2022-07-12 ENCOUNTER — Encounter: Payer: Self-pay | Admitting: Nurse Practitioner

## 2022-07-12 ENCOUNTER — Ambulatory Visit: Payer: Medicare PPO | Admitting: Nurse Practitioner

## 2022-07-12 VITALS — BP 115/77 | HR 89 | Ht 64.0 in | Wt 176.6 lb

## 2022-07-12 DIAGNOSIS — E890 Postprocedural hypothyroidism: Secondary | ICD-10-CM

## 2022-07-12 MED ORDER — LEVOTHYROXINE SODIUM 100 MCG PO TABS: 50.0000 ug | ORAL_TABLET | Freq: Every day | ORAL | 1 refills | Status: DC

## 2022-07-12 NOTE — Patient Instructions (Signed)

## 2022-07-12 NOTE — Progress Notes (Signed)
07/12/2022     Endocrinology Follow Up Note    Subjective:    Patient ID: Gwendolyn Gordon, female    DOB: 1951/01/30, PCP Ocie Bob, FNP.   Past Medical History:  Diagnosis Date   Allergies    Ganglion cyst    Vitamin D deficiency     Past Surgical History:  Procedure Laterality Date   APPENDECTOMY     PARTIAL HYSTERECTOMY      Social History   Socioeconomic History   Marital status: Widowed    Spouse name: Not on file   Number of children: Not on file   Years of education: Not on file   Highest education level: Not on file  Occupational History   Not on file  Tobacco Use   Smoking status: Never   Smokeless tobacco: Never  Vaping Use   Vaping Use: Never used  Substance and Sexual Activity   Alcohol use: Never   Drug use: Never   Sexual activity: Not on file  Other Topics Concern   Not on file  Social History Narrative   Not on file   Social Determinants of Health   Financial Resource Strain: Not on file  Food Insecurity: Not on file  Transportation Needs: Not on file  Physical Activity: Not on file  Stress: Not on file  Social Connections: Not on file    Family History  Problem Relation Age of Onset   Diabetes Mother     Outpatient Encounter Medications as of 07/12/2022  Medication Sig   cetirizine (ZYRTEC ALLERGY) 10 MG tablet daily as needed.   Cholecalciferol 50 MCG (2000 UT) CAPS daily.   [DISCONTINUED] levothyroxine (SYNTHROID) 50 MCG tablet Take 1 tablet (50 mcg total) by mouth daily before breakfast.   levothyroxine (SYNTHROID) 100 MCG tablet Take 0.5 tablets (50 mcg total) by mouth daily before breakfast.   propranolol (INDERAL) 20 MG tablet TAKE 1 TABLET(20 MG) BY MOUTH TWICE DAILY (Patient not taking: Reported on 07/12/2022)   No facility-administered encounter medications on file as of 07/12/2022.    ALLERGIES: No Known Allergies  VACCINATION STATUS: Immunization History  Administered Date(s) Administered    Moderna Sars-Covid-2 Vaccination 10/12/2019, 11/09/2019   PFIZER(Purple Top)SARS-COV-2 Vaccination 08/05/2020   Pfizer Covid-19 Vaccine Bivalent Booster 28yrs & up 06/08/2021     HPI  Gwendolyn Gordon is 71 y.o. female who presents today with a medical history as above. she is being seen in follow up after being seen in consultation for hyperthyroidism requested by Ocie Bob, FNP.  She had her annual physical and thyroid labs were drawn and came back abnormal.  She does not report any specific symptoms of thyroid dysfunction other than difficulty losing weight, hair loss, and muscle aches.  her most recent thyroid labs revealed suppressed TSH of < 0.01 and normal Free T4 of 1.41 on 11/29/21.  She has never had any imaging of her thyroid in the past.  she denies dysphagia, choking, shortness of breath, no recent voice change.    she does family history of thyroid dysfunction in her daughter-hyperthyroidism, but denies family hx of thyroid cancer. she denies personal history of goiter. she is not on any anti-thyroid medications nor on any thyroid hormone supplements. Denies use of Biotin containing supplements.  she is willing to proceed with appropriate work up and therapy for thyrotoxicosis.   She is S/p RAI ablation on 02/28/22.    Review of systems  Constitutional: + increasing body weight, current  Body mass index is 30.31 kg/m., + fatigue, no subjective hyperthermia, no subjective hypothermia Eyes: no blurry vision, no xerophthalmia ENT: no sore throat, no nodules palpated in throat, no dysphagia/odynophagia, no hoarseness Cardiovascular: no chest pain, no shortness of breath, no palpitations, no leg swelling Respiratory: no cough, no shortness of breath Gastrointestinal: no nausea/vomiting/diarrhea Musculoskeletal: diffuse muscle/joint aches-improving Skin: no rashes, no hyperemia, mild hair loss-improving Neurological: no tremors, no numbness, no tingling, no  dizziness Psychiatric: no depression, no anxiety   Objective:    BP 115/77 (BP Location: Left Arm, Patient Position: Sitting, Cuff Size: Normal)   Pulse 89   Ht 5\' 4"  (1.626 m)   Wt 176 lb 9.6 oz (80.1 kg)   BMI 30.31 kg/m   Wt Readings from Last 3 Encounters:  07/12/22 176 lb 9.6 oz (80.1 kg)  04/12/22 172 lb (78 kg)  01/19/22 170 lb (77.1 kg)     BP Readings from Last 3 Encounters:  07/12/22 115/77  04/12/22 115/74  01/19/22 111/72     Physical Exam- Limited  Constitutional:  Body mass index is 30.31 kg/m. , not in acute distress, normal state of mind Eyes:  EOMI, no exophthalmos Neck: Supple Cardiovascular: RRR, no murmurs, rubs, or gallops, no edema Respiratory: Adequate breathing efforts, no crackles, rales, rhonchi, or wheezing Musculoskeletal: no gross deformities, strength intact in all four extremities, no gross restriction of joint movements Skin:  no rashes, no hyperemia Neurological: no tremor with outstretched hands   CMP  No results found for: "NA", "K", "CL", "CO2", "GLUCOSE", "BUN", "CREATININE", "CALCIUM", "PROT", "ALBUMIN", "AST", "ALT", "ALKPHOS", "BILITOT", "GFRNONAA", "GFRAA"   CBC No results found for: "WBC", "RBC", "HGB", "HCT", "PLT", "MCV", "MCH", "MCHC", "RDW", "LYMPHSABS", "MONOABS", "EOSABS", "BASOSABS"   Diabetic Labs (most recent): No results found for: "HGBA1C", "MICROALBUR"  Lipid Panel  No results found for: "CHOL", "TRIG", "HDL", "CHOLHDL", "VLDL", "LDLCALC", "LDLDIRECT", "LABVLDL"   Lab Results  Component Value Date   TSH 31.500 (H) 07/07/2022   TSH 33.300 (H) 05/30/2022   TSH 0.085 (L) 04/06/2022   TSH <0.005 (L) 01/04/2022   TSH 0.01 (A) 11/29/2021   FREET4 0.78 (L) 07/07/2022   FREET4 0.55 (L) 05/30/2022   FREET4 0.87 04/06/2022   FREET4 1.75 01/04/2022     TSH TSH Resulted: 11/29/21 0000  Result status: Final  Resulting lab: OTHER  Reference range: 0.41 - 5.90  Value: 0.01 Abnormal    Comment: TSH < 0.01;  Free T4-1.41    Latest Reference Range & Units 11/29/21 00:00 01/04/22 14:57  TSH 0.450 - 4.500 uIU/mL 0.01 ! (E) <0.005 (L)  Triiodothyronine,Free,Serum 2.0 - 4.4 pg/mL  5.5 (H)  T4,Free(Direct) 0.82 - 1.77 ng/dL  03/06/22  Thyroperoxidase Ab SerPl-aCnc 0 - 34 IU/mL  245 (H)  Thyroglobulin Antibody 0.0 - 0.9 IU/mL  <1.0  !: Data is abnormal (L): Data is abnormally low (H): Data is abnormally high (E): External lab result  Uptake and Scan from 01/14/22 CLINICAL DATA:  Symptomatic hyperthyroidism, TSH < 0.005   EXAM: THYROID SCAN AND UPTAKE - 4 AND 24 HOURS   TECHNIQUE: Following oral administration of I-123 capsule, anterior planar imaging was acquired at 24 hours. Thyroid uptake was calculated with a thyroid probe at 4-6 hours and 24 hours.   RADIOPHARMACEUTICALS:  300 uCi I-123 sodium iodide p.o.   COMPARISON:  None   FINDINGS: Small foci of increased and decreased tracer uptake in both thyroid lobes consistent with a multinodular thyroid gland.   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     Electronically Signed   By: Ulyses Southward M.D.   On: 01/14/2022 10:19   Latest Reference Range & Units 11/29/21 00:00 01/04/22 14:57 04/06/22 13:01 05/30/22 14:16 07/07/22 14:35  TSH 0.450 - 4.500 uIU/mL 0.01 ! 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  She had RAI ablation on 02/28/22.  Her repeat thyroid function tests are now consistent with under-active thyroid as a result of her RAI ablation.     I discussed and increased her Levothyroxine to 100 mcg po daily before breakfast.    - The correct intake of thyroid hormone (Levothyroxine, Synthroid), is on empty stomach first thing in the morning, with water, separated by at least 30 minutes from breakfast and other medications,  and separated by more than 4 hours from calcium, iron, multivitamins, acid reflux medications (PPIs).  - This medication is a life-long medication and will be needed to correct thyroid hormone imbalances for the rest of your life.  The dose may change from time to time, based on thyroid blood work.  - It is extremely important to be consistent taking this medication, near the same time each morning.  -AVOID TAKING PRODUCTS CONTAINING BIOTIN (commonly found in Hair, Skin, Nails vitamins) AS IT INTERFERES WITH THE VALIDITY OF THYROID FUNCTION BLOOD TESTS.          -Patient is advised to maintain close follow up with Lenoria Chime, FNP for primary care needs.    I spent 20 minutes in the care of the patient today including review of labs from Thyroid Function, CMP, and other relevant labs ; imaging/biopsy records (current and previous including abstractions from other facilities); face-to-face time discussing  her lab results and symptoms, medications doses, her options of short and long term treatment based on the latest standards of care / guidelines;   and documenting the encounter.  Kathlen Mody  participated in the discussions, expressed understanding, and voiced agreement with the above plans.  All questions were answered to her satisfaction. she is encouraged to contact clinic should she have any questions or concerns prior to her return visit.  Follow up plan: Return in about 2 months (around 09/11/2022) for  Thyroid follow up, Previsit labs, Virtual visit ok.   Thank you for involving me in the care of this pleasant patient, and I will continue to update you with her progress.   Ronny Bacon, [facility] [address] [place], [place] [address] Phone: [phone] Fax: [phone]  07/12/2022, 1:54 PM

## 2022-07-14 ENCOUNTER — Other Ambulatory Visit: Payer: Self-pay | Admitting: Nurse Practitioner

## 2022-07-14 ENCOUNTER — Telehealth: Payer: Self-pay | Admitting: Nurse Practitioner

## 2022-07-14 MED ORDER — LEVOTHYROXINE SODIUM 100 MCG PO TABS: 100.0000 ug | ORAL_TABLET | Freq: Every day | ORAL | 1 refills | Status: DC

## 2022-07-14 NOTE — Telephone Encounter (Signed)
I fixed it.  She should be taking the 100 mcg as I did increase it at last visit.

## 2022-07-14 NOTE — Telephone Encounter (Signed)
[facility] left a VM stating that the rx for Levothyroxine says take 0.5 tablets of the 100 mcg tablet. The patient told the pharmacy they were told to increase the medication. The pharmacy is asking for a call back to clarify. Thank you

## 2022-08-28 ENCOUNTER — Other Ambulatory Visit: Payer: Self-pay | Admitting: Nurse Practitioner

## 2022-09-01 ENCOUNTER — Other Ambulatory Visit: Payer: Self-pay

## 2022-09-01 MED ORDER — LEVOTHYROXINE SODIUM 100 MCG PO TABS: 100.0000 ug | ORAL_TABLET | Freq: Every day | ORAL | 0 refills | Status: DC

## 2022-09-08 LAB — T4, FREE: Free T4: 1.15 ng/dL (ref 0.82–1.77)

## 2022-09-08 LAB — TSH: TSH: 4.2 u[IU]/mL (ref 0.450–4.500)

## 2022-09-08 NOTE — Patient Instructions (Incomplete)

## 2022-09-12 ENCOUNTER — Ambulatory Visit: Payer: Medicare PPO | Admitting: Nurse Practitioner

## 2022-09-12 DIAGNOSIS — E890 Postprocedural hypothyroidism: Secondary | ICD-10-CM

## 2022-09-13 ENCOUNTER — Ambulatory Visit: Payer: Medicare PPO | Admitting: Nurse Practitioner

## 2022-09-19 NOTE — Patient Instructions (Signed)

## 2022-09-20 ENCOUNTER — Encounter: Payer: Self-pay | Admitting: Nurse Practitioner

## 2022-09-20 ENCOUNTER — Ambulatory Visit: Payer: Medicare PPO | Admitting: Nurse Practitioner

## 2022-09-20 VITALS — BP 122/79 | HR 90 | Ht 64.0 in | Wt 179.4 lb

## 2022-09-20 DIAGNOSIS — E890 Postprocedural hypothyroidism: Secondary | ICD-10-CM | POA: Diagnosis not present

## 2022-09-20 MED ORDER — LEVOTHYROXINE SODIUM 112 MCG PO TABS: 112.0000 ug | ORAL_TABLET | Freq: Every day | ORAL | 0 refills | Status: DC

## 2022-09-20 NOTE — Progress Notes (Signed)
09/20/2022     Endocrinology Follow Up Note    Subjective:    Patient ID: Gwendolyn Gordon, female    DOB: 17-Oct-1950, PCP Ocie Bob, FNP.   Past Medical History:  Diagnosis Date   Allergies    Ganglion cyst    Vitamin D deficiency     Past Surgical History:  Procedure Laterality Date   APPENDECTOMY     PARTIAL HYSTERECTOMY      Social History   Socioeconomic History   Marital status: Widowed    Spouse name: Not on file   Number of children: Not on file   Years of education: Not on file   Highest education level: Not on file  Occupational History   Not on file  Tobacco Use   Smoking status: Never   Smokeless tobacco: Never  Vaping Use   Vaping Use: Never used  Substance and Sexual Activity   Alcohol use: Never   Drug use: Never   Sexual activity: Not on file  Other Topics Concern   Not on file  Social History Narrative   Not on file   Social Determinants of Health   Financial Resource Strain: Not on file  Food Insecurity: Not on file  Transportation Needs: Not on file  Physical Activity: Not on file  Stress: Not on file  Social Connections: Not on file    Family History  Problem Relation Age of Onset   Diabetes Mother     Outpatient Encounter Medications as of 09/20/2022  Medication Sig   cetirizine (ZYRTEC ALLERGY) 10 MG tablet daily as needed.   Cholecalciferol 50 MCG (2000 UT) CAPS daily.   [DISCONTINUED] levothyroxine (SYNTHROID) 100 MCG tablet Take 1 tablet (100 mcg total) by mouth daily before breakfast.   levothyroxine (SYNTHROID) 112 MCG tablet Take 1 tablet (112 mcg total) by mouth daily before breakfast.   [DISCONTINUED] levothyroxine (SYNTHROID) 100 MCG tablet Take 1 tablet (100 mcg total) by mouth daily.   No facility-administered encounter medications on file as of 09/20/2022.    ALLERGIES: No Known Allergies  VACCINATION STATUS: Immunization History  Administered Date(s) Administered   Moderna Sars-Covid-2  Vaccination 10/12/2019, 11/09/2019   PFIZER(Purple Top)SARS-COV-2 Vaccination 08/05/2020   Pfizer Covid-19 Vaccine Bivalent Booster 49yrs & up 06/08/2021     HPI  Gwendolyn Gordon is 72 y.o. female who presents today with a medical history as above. she is being seen in follow up after being seen in consultation for hyperthyroidism requested by Ocie Bob, FNP.  She had her annual physical and thyroid labs were drawn and came back abnormal.  She does not report any specific symptoms of thyroid dysfunction other than difficulty losing weight, hair loss, and muscle aches.  her most recent thyroid labs revealed suppressed TSH of < 0.01 and normal Free T4 of 1.41 on 11/29/21.  She has never had any imaging of her thyroid in the past.  she denies dysphagia, choking, shortness of breath, no recent voice change.    she does family history of thyroid dysfunction in her daughter-hyperthyroidism, but denies family hx of thyroid cancer. she denies personal history of goiter. she is not on any anti-thyroid medications nor on any thyroid hormone supplements. Denies use of Biotin containing supplements.  she is willing to proceed with appropriate work up and therapy for thyrotoxicosis.   She is S/p RAI ablation on 02/28/22, is now on thyroid hormone repalcement.    Review of systems  Constitutional: + steadily increasing  body weight,  current Body mass index is 30.79 kg/m. , + fatigue, no subjective hyperthermia, no subjective hypothermia Eyes: no blurry vision, no xerophthalmia ENT: no sore throat, no nodules palpated in throat, no dysphagia/odynophagia, no hoarseness Cardiovascular: no chest pain, no shortness of breath, no palpitations, no leg swelling Respiratory: no cough, no shortness of breath Gastrointestinal: no nausea/vomiting/diarrhea Musculoskeletal: no muscle/joint aches Skin: no rashes, no hyperemia Neurological: no tremors, no numbness, no tingling, no dizziness Psychiatric: no  depression, no anxiety   Objective:    BP 122/79 (BP Location: Left Arm, Patient Position: Sitting, Cuff Size: Normal)   Pulse 90   Ht 5\' 4"  (1.626 m)   Wt 179 lb 6.4 oz (81.4 kg)   BMI 30.79 kg/m   Wt Readings from Last 3 Encounters:  09/20/22 179 lb 6.4 oz (81.4 kg)  07/12/22 176 lb 9.6 oz (80.1 kg)  04/12/22 172 lb (78 kg)     BP Readings from Last 3 Encounters:  09/20/22 122/79  07/12/22 115/77  04/12/22 115/74     Physical Exam- Limited  Constitutional:  Body mass index is 30.79 kg/m. , not in acute distress, normal state of mind Eyes:  EOMI, no exophthalmos Musculoskeletal: no gross deformities, strength intact in all four extremities, no gross restriction of joint movements Skin:  no rashes, no hyperemia Neurological: no tremor with outstretched hands   CMP  No results found for: "NA", "K", "CL", "CO2", "GLUCOSE", "BUN", "CREATININE", "CALCIUM", "PROT", "ALBUMIN", "AST", "ALT", "ALKPHOS", "BILITOT", "GFRNONAA", "GFRAA"   CBC No results found for: "WBC", "RBC", "HGB", "HCT", "PLT", "MCV", "MCH", "MCHC", "RDW", "LYMPHSABS", "MONOABS", "EOSABS", "BASOSABS"   Diabetic Labs (most recent): No results found for: "HGBA1C", "MICROALBUR"  Lipid Panel  No results found for: "CHOL", "TRIG", "HDL", "CHOLHDL", "VLDL", "LDLCALC", "LDLDIRECT", "LABVLDL"   Lab Results  Component Value Date   TSH 4.200 09/07/2022   TSH 31.500 (H) 07/07/2022   TSH 33.300 (H) 05/30/2022   TSH 0.085 (L) 04/06/2022   TSH <0.005 (L) 01/04/2022   TSH 0.01 (A) 11/29/2021   FREET4 1.15 09/07/2022   FREET4 0.78 (L) 07/07/2022   FREET4 0.55 (L) 05/30/2022   FREET4 0.87 04/06/2022   FREET4 1.75 01/04/2022     TSH TSH Resulted: 11/29/21 0000  Result status: Final  Resulting lab: OTHER  Reference range: 0.41 - 5.90  Value: 0.01 Abnormal    Comment: TSH < 0.01; Free T4-1.41    Uptake and Scan from 01/14/22 CLINICAL DATA:  Symptomatic hyperthyroidism, TSH < 0.005   EXAM: THYROID  SCAN AND UPTAKE - 4 AND 24 HOURS   TECHNIQUE: Following oral administration of I-123 capsule, anterior planar imaging was acquired at 24 hours. Thyroid uptake was calculated with a thyroid probe at 4-6 hours and 24 hours.   RADIOPHARMACEUTICALS:  300 uCi I-123 sodium iodide p.o.   COMPARISON:  None   FINDINGS: Small foci of increased and decreased tracer uptake in both thyroid lobes consistent with a multinodular thyroid gland.   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     Electronically Signed   By: 03/16/22 M.D.   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  She had RAI ablation on 02/28/22.  Her previsit thyroid function tests are consistent with slight under-replacement.  She will benefit from increase in her Levothyroxine to 112 mcg po daily before breakfast.   - The correct intake of thyroid hormone (Levothyroxine, Synthroid), is on empty stomach first thing in the morning, with water, separated by at least 30 minutes from breakfast and other medications,  and separated by more than 4 hours from calcium, iron, multivitamins, acid reflux medications (PPIs).  - This medication is a life-long medication and will be needed to correct thyroid hormone imbalances for the rest of your life.  The dose may change from time to time, based on  thyroid blood work.  - It is extremely important to be consistent taking this medication, near the same time each morning.  -AVOID TAKING PRODUCTS CONTAINING BIOTIN (commonly found in Hair, Skin, Nails vitamins) AS IT INTERFERES WITH THE VALIDITY OF THYROID FUNCTION BLOOD TESTS.    I also gave her information about WFPB lifestyle changes.      -Patient is advised to maintain close follow up with Ocie Bob, FNP for primary care needs.    I spent 20 minutes in the care of the patient today including review of labs from Thyroid Function, CMP, and other relevant labs ; imaging/biopsy records (current and previous including abstractions from other facilities); face-to-face time discussing  her lab results and symptoms, medications doses, her options of short and long term treatment based on the latest standards of care / guidelines;   and documenting the encounter.  Gwendolyn Gordon  participated in the discussions, expressed understanding, and voiced agreement with the above plans.  All questions were answered to her satisfaction. she is encouraged to contact clinic should she have any questions or concerns prior to her return visit.  Follow up plan: Return in about 2 months (around 11/19/2022) for Thyroid follow up, Previsit labs.   Thank you for involving me in the care of this pleasant patient, and I will continue to update you with her progress.   Rayetta Pigg, [facility] [address] [place], [place] [address] Phone: [phone] Fax: [phone]  09/20/2022, 1:25 PM

## 2022-11-16 LAB — TSH: TSH: 0.132 u[IU]/mL — ABNORMAL LOW (ref 0.450–4.500)

## 2022-11-16 LAB — T4, FREE: Free T4: 1.52 ng/dL (ref 0.82–1.77)

## 2022-11-21 NOTE — Patient Instructions (Signed)

## 2022-11-22 ENCOUNTER — Ambulatory Visit: Payer: Medicare PPO | Admitting: Nurse Practitioner

## 2022-11-22 ENCOUNTER — Encounter: Payer: Self-pay | Admitting: Nurse Practitioner

## 2022-11-22 VITALS — BP 119/77 | HR 80 | Ht 64.0 in | Wt 181.0 lb

## 2022-11-22 DIAGNOSIS — E890 Postprocedural hypothyroidism: Secondary | ICD-10-CM

## 2022-11-22 MED ORDER — LEVOTHYROXINE SODIUM 112 MCG PO TABS: 112.0000 ug | ORAL_TABLET | Freq: Every day | ORAL | 1 refills | Status: DC

## 2022-11-22 NOTE — Progress Notes (Signed)
11/22/2022     Endocrinology Follow Up Note    Subjective:    Patient ID: Gwendolyn Gordon, female    DOB: Nov 17, 1950, PCP Gwendolyn Bob, FNP.   Past Medical History:  Diagnosis Date   Allergies    Ganglion cyst    Vitamin D deficiency     Past Surgical History:  Procedure Laterality Date   APPENDECTOMY     PARTIAL HYSTERECTOMY      Social History   Socioeconomic History   Marital status: Widowed    Spouse name: Not on file   Number of children: Not on file   Years of education: Not on file   Highest education level: Not on file  Occupational History   Not on file  Tobacco Use   Smoking status: Never   Smokeless tobacco: Never  Vaping Use   Vaping Use: Never used  Substance and Sexual Activity   Alcohol use: Never   Drug use: Never   Sexual activity: Not on file  Other Topics Concern   Not on file  Social History Narrative   Not on file   Social Determinants of Health   Financial Resource Strain: Not on file  Food Insecurity: Not on file  Transportation Needs: Not on file  Physical Activity: Not on file  Stress: Not on file  Social Connections: Not on file    Family History  Problem Relation Age of Onset   Diabetes Mother     Outpatient Encounter Medications as of 11/22/2022  Medication Sig   cetirizine (ZYRTEC ALLERGY) 10 MG tablet daily as needed.   Cholecalciferol 50 MCG (2000 UT) CAPS daily.   [DISCONTINUED] levothyroxine (SYNTHROID) 112 MCG tablet Take 1 tablet (112 mcg total) by mouth daily before breakfast.   levothyroxine (SYNTHROID) 112 MCG tablet Take 1 tablet (112 mcg total) by mouth daily before breakfast.   No facility-administered encounter medications on file as of 11/22/2022.    ALLERGIES: No Known Allergies  VACCINATION STATUS: Immunization History  Administered Date(s) Administered   Moderna Sars-Covid-2 Vaccination 10/12/2019, 11/09/2019   PFIZER(Purple Top)SARS-COV-2 Vaccination 08/05/2020   Pfizer  Covid-19 Vaccine Bivalent Booster 46yrs & up 06/08/2021     HPI  Gwendolyn Gordon is 72 y.o. female who presents today with a medical history as above. she is being seen in follow up after being seen in consultation for hyperthyroidism requested by Gwendolyn Bob, FNP.  She had her annual physical and thyroid labs were drawn and came back abnormal.  She does not report any specific symptoms of thyroid dysfunction other than difficulty losing weight, hair loss, and muscle aches.  her most recent thyroid labs revealed suppressed TSH of < 0.01 and normal Free T4 of 1.41 on 11/29/21.  She has never had any imaging of her thyroid in the past.  she denies dysphagia, choking, shortness of breath, no recent voice change.    she does family history of thyroid dysfunction in her daughter-hyperthyroidism, but denies family hx of thyroid cancer. she denies personal history of goiter. she is not on any anti-thyroid medications nor on any thyroid hormone supplements. Denies use of Biotin containing supplements.  she is willing to proceed with appropriate work up and therapy for thyrotoxicosis.   She is S/p RAI ablation on 02/28/22, is now on thyroid hormone repalcement.    Review of systems  Constitutional: + slowly increasing body weight- very frustrating for her given her strict diet,  current Body mass index is 31.07  kg/m. , + fatigue, + intermittent subjective hyperthermia, no subjective hypothermia Eyes: no blurry vision, no xerophthalmia ENT: no sore throat, no nodules palpated in throat, no dysphagia/odynophagia, no hoarseness Cardiovascular: no chest pain, no shortness of breath, no palpitations, no leg swelling Respiratory: no cough, no shortness of breath Gastrointestinal: no nausea/vomiting/diarrhea Musculoskeletal: no muscle/joint aches Skin: no rashes, no hyperemia Neurological: no tremors, no numbness, no tingling, no dizziness Psychiatric: no depression, no anxiety   Objective:     BP 119/77 (BP Location: Left Arm, Patient Position: Sitting, Cuff Size: Normal)   Pulse 80   Ht 5\' 4"  (1.626 m)   Wt 181 lb (82.1 kg)   BMI 31.07 kg/m   Wt Readings from Last 3 Encounters:  11/22/22 181 lb (82.1 kg)  09/20/22 179 lb 6.4 oz (81.4 kg)  07/12/22 176 lb 9.6 oz (80.1 kg)     BP Readings from Last 3 Encounters:  11/22/22 119/77  09/20/22 122/79  07/12/22 115/77     Physical Exam- Limited  Constitutional:  Body mass index is 31.07 kg/m. , not in acute distress, normal state of mind Eyes:  EOMI, no exophthalmos Musculoskeletal: no gross deformities, strength intact in all four extremities, no gross restriction of joint movements Skin:  no rashes, no hyperemia Neurological: no tremor with outstretched hands   CMP  No results found for: "NA", "K", "CL", "CO2", "GLUCOSE", "BUN", "CREATININE", "CALCIUM", "PROT", "ALBUMIN", "AST", "ALT", "ALKPHOS", "BILITOT", "GFRNONAA", "GFRAA"   CBC No results found for: "WBC", "RBC", "HGB", "HCT", "PLT", "MCV", "MCH", "MCHC", "RDW", "LYMPHSABS", "MONOABS", "EOSABS", "BASOSABS"   Diabetic Labs (most recent): No results found for: "HGBA1C", "MICROALBUR"  Lipid Panel  No results found for: "CHOL", "TRIG", "HDL", "CHOLHDL", "VLDL", "LDLCALC", "LDLDIRECT", "LABVLDL"   Lab Results  Component Value Date   TSH 0.132 (L) 11/15/2022   TSH 4.200 09/07/2022   TSH 31.500 (H) 07/07/2022   TSH 33.300 (H) 05/30/2022   TSH 0.085 (L) 04/06/2022   TSH <0.005 (L) 01/04/2022   TSH 0.01 (A) 11/29/2021   FREET4 1.52 11/15/2022   FREET4 1.15 09/07/2022   FREET4 0.78 (L) 07/07/2022   FREET4 0.55 (L) 05/30/2022   FREET4 0.87 04/06/2022   FREET4 1.75 01/04/2022     TSH TSH Resulted: 11/29/21 0000  Result status: Final  Resulting lab: OTHER  Reference range: 0.41 - 5.90  Value: 0.01 Abnormal    Comment: TSH < 0.01; Free T4-1.41    Uptake and Scan from 01/14/22 CLINICAL DATA:  Symptomatic hyperthyroidism, TSH < 0.005    EXAM: THYROID SCAN AND UPTAKE - 4 AND 24 HOURS   TECHNIQUE: Following oral administration of I-123 capsule, anterior planar imaging was acquired at 24 hours. Thyroid uptake was calculated with a thyroid probe at 4-6 hours and 24 hours.   RADIOPHARMACEUTICALS:  300 uCi I-123 sodium iodide p.o.   COMPARISON:  None   FINDINGS: Small foci of increased and decreased tracer uptake in both thyroid lobes consistent with a multinodular thyroid gland.   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     Electronically Signed   By: Lavonia Dana M.D.   On: 01/14/2022 10:19   Latest Reference Range & Units 04/06/22 13:01 05/30/22 14:16 07/07/22 14:35 09/07/22 13:30 11/15/22 13:00  TSH 0.450 - 4.500 uIU/mL 0.085 (L) 33.300 (H) 31.500 (H) 4.200 0.132 (  L)  Triiodothyronine,Free,Serum 2.0 - 4.4 pg/mL 2.0      T4,Free(Direct) 0.82 - 1.77 ng/dL 0.87 0.55 (L) 0.78 (L) 1.15 1.52  (L): Data is abnormally low (H): Data is abnormally high  Assessment & Plan:   1) Hypothyroidism s/p RAI ablation for Graves disease  she is being seen at a kind request of White, Jannifer Rodney, FNP.  She had RAI ablation on 02/28/22.  Her previsit thyroid function tests are consistent with appropriate hormone replacement.  Her symptoms keep fluctuating, having good and bad days.  She is advised to continue Levothyroxine 112 mcg po daily before breakfast for now.  May consider changing to branded-Synthroid at next visit if symptoms not managed properly.   - The correct intake of thyroid hormone (Levothyroxine, Synthroid), is on empty stomach first thing in the morning, with water, separated by at least 30 minutes from breakfast and other medications,  and separated by more than 4 hours from calcium, iron, multivitamins, acid reflux  medications (PPIs).  - This medication is a life-long medication and will be needed to correct thyroid hormone imbalances for the rest of your life.  The dose may change from time to time, based on thyroid blood work.  - It is extremely important to be consistent taking this medication, near the same time each morning.  -AVOID TAKING PRODUCTS CONTAINING BIOTIN (commonly found in Hair, Skin, Nails vitamins) AS IT INTERFERES WITH THE VALIDITY OF THYROID FUNCTION BLOOD TESTS.    She has started implementing WFPB lifestyle changes, eats meat maybe twice per week and maintains active lifestyle with walking 5 days per week.      -Patient is advised to maintain close follow up with Gwendolyn Bob, FNP for primary care needs.     I spent  33  minutes in the care of the patient today including review of labs from Thyroid Function, CMP, and other relevant labs ; imaging/biopsy records (current and previous including abstractions from other facilities); face-to-face time discussing  her lab results and symptoms, medications doses, her options of short and long term treatment based on the latest standards of care / guidelines;   and documenting the encounter.  Gwendolyn Gordon  participated in the discussions, expressed understanding, and voiced agreement with the above plans.  All questions were answered to her satisfaction. she is encouraged to contact clinic should she have any questions or concerns prior to her return visit.  Follow up plan: Return in about 4 months (around 03/24/2023) for Thyroid follow up, Previsit labs.   Thank you for involving me in the care of this pleasant patient, and I will continue to update you with her progress.   Rayetta Pigg, [facility] [address] [place], [place] [address] Phone: [phone] Fax: [phone]  11/22/2022, 1:20 PM

## 2023-02-25 ENCOUNTER — Other Ambulatory Visit: Payer: Self-pay | Admitting: Nurse Practitioner

## 2023-03-14 LAB — T4, FREE: Free T4: 1.36 ng/dL (ref 0.82–1.77)

## 2023-03-14 LAB — TSH: TSH: 0.411 u[IU]/mL — ABNORMAL LOW (ref 0.450–4.500)

## 2023-03-26 NOTE — Patient Instructions (Signed)

## 2023-03-27 ENCOUNTER — Encounter: Payer: Self-pay | Admitting: Nurse Practitioner

## 2023-03-27 ENCOUNTER — Ambulatory Visit: Payer: Medicare PPO | Admitting: Nurse Practitioner

## 2023-03-27 VITALS — BP 104/69 | HR 89 | Ht 64.0 in | Wt 177.6 lb

## 2023-03-27 DIAGNOSIS — E890 Postprocedural hypothyroidism: Secondary | ICD-10-CM

## 2023-03-27 MED ORDER — SYNTHROID 112 MCG PO TABS: 112.0000 ug | ORAL_TABLET | Freq: Every day | ORAL | 1 refills | Status: DC

## 2023-03-27 NOTE — Progress Notes (Signed)
03/27/2023     Endocrinology Follow Up Note    Subjective:    Patient ID: Gwendolyn Gordon, female    DOB: 07-Aug-1951, PCP Gwendolyn Chime, FNP.   Past Medical History:  Diagnosis Date   Allergies    Ganglion cyst    Vitamin D deficiency     Past Surgical History:  Procedure Laterality Date   APPENDECTOMY     PARTIAL HYSTERECTOMY      Social History   Socioeconomic History   Marital status: Widowed    Spouse name: Not on file   Number of children: Not on file   Years of education: Not on file   Highest education level: Not on file  Occupational History   Not on file  Tobacco Use   Smoking status: Never   Smokeless tobacco: Never  Vaping Use   Vaping status: Never Used  Substance and Sexual Activity   Alcohol use: Never   Drug use: Never   Sexual activity: Not on file  Other Topics Concern   Not on file  Social History Narrative   Not on file   Social Determinants of Health   Financial Resource Strain: Not on file  Food Insecurity: Not on file  Transportation Needs: Not on file  Physical Activity: Not on file  Stress: Not on file  Social Connections: Not on file    Family History  Problem Relation Age of Onset   Diabetes Mother     Outpatient Encounter Medications as of 03/27/2023  Medication Sig   cetirizine (ZYRTEC ALLERGY) 10 MG tablet daily as needed.   Cholecalciferol 50 MCG (2000 UT) CAPS daily.   SYNTHROID 112 MCG tablet Take 1 tablet (112 mcg total) by mouth daily before breakfast.   [DISCONTINUED] levothyroxine (SYNTHROID) 112 MCG tablet Take 1 tablet (112 mcg total) by mouth daily before breakfast.   No facility-administered encounter medications on file as of 03/27/2023.    ALLERGIES: No Known Allergies  VACCINATION STATUS: Immunization History  Administered Date(s) Administered   Moderna Sars-Covid-2 Vaccination 10/12/2019, 11/09/2019   PFIZER(Purple Top)SARS-COV-2 Vaccination 08/05/2020   Pfizer Covid-19 Vaccine  Bivalent Booster 36yrs & up 06/08/2021     HPI  Gwendolyn Gordon is 72 y.o. female who presents today with a medical history as above. she is being seen in follow up after being seen in consultation for hyperthyroidism requested by Gwendolyn Chime, FNP.  She had her annual physical and thyroid labs were drawn and came back abnormal.  She does not report any specific symptoms of thyroid dysfunction other than difficulty losing weight, hair loss, and muscle aches.  her most recent thyroid labs revealed suppressed TSH of < 0.01 and normal Free T4 of 1.41 on 11/29/21.  She has never had any imaging of her thyroid in the past.  she denies dysphagia, choking, shortness of breath, no recent voice change.    she does family history of thyroid dysfunction in her daughter-hyperthyroidism, but denies family hx of thyroid cancer. she denies personal history of goiter. she is not on any anti-thyroid medications nor on any thyroid hormone supplements. Denies use of Biotin containing supplements.  she is willing to proceed with appropriate work up and therapy for thyrotoxicosis.   She is S/p RAI ablation on 02/28/22, is now on thyroid hormone repalcement.    Review of systems  Constitutional: + Minimally fluctuating body weight,  current Body mass index is 30.48 kg/m. , + fatigue, + subjective hyperthermia (worsening), no  subjective hypothermia Eyes: no blurry vision, no xerophthalmia ENT: no sore throat, no nodules palpated in throat, no dysphagia/odynophagia, no hoarseness Cardiovascular: no chest pain, no shortness of breath, no palpitations, no leg swelling Respiratory: no cough, no shortness of breath Gastrointestinal: no nausea/vomiting/diarrhea Musculoskeletal: no muscle/joint aches Skin: no rashes, no hyperemia Neurological: no tremors, no numbness, no tingling, no dizziness Psychiatric: no depression, no anxiety   Objective:    BP 104/69 (BP Location: Left Arm, Patient Position: Sitting,  Cuff Size: Large)   Pulse 89   Ht 5\' 4"  (1.626 m)   Wt 177 lb 9.6 oz (80.6 kg)   BMI 30.48 kg/m   Wt Readings from Last 3 Encounters:  03/27/23 177 lb 9.6 oz (80.6 kg)  11/22/22 181 lb (82.1 kg)  09/20/22 179 lb 6.4 oz (81.4 kg)     BP Readings from Last 3 Encounters:  03/27/23 104/69  11/22/22 119/77  09/20/22 122/79     Physical Exam- Limited  Constitutional:  Body mass index is 30.48 kg/m. , not in acute distress, normal state of mind Eyes:  EOMI, no exophthalmos Musculoskeletal: no gross deformities, strength intact in all four extremities, no gross restriction of joint movements Skin:  no rashes, no hyperemia Neurological: no tremor with outstretched hands   CMP  No results found for: "NA", "K", "CL", "CO2", "GLUCOSE", "BUN", "CREATININE", "CALCIUM", "PROT", "ALBUMIN", "AST", "ALT", "ALKPHOS", "BILITOT", "GFRNONAA", "GFRAA"   CBC No results found for: "WBC", "RBC", "HGB", "HCT", "PLT", "MCV", "MCH", "MCHC", "RDW", "LYMPHSABS", "MONOABS", "EOSABS", "BASOSABS"   Diabetic Labs (most recent): No results found for: "HGBA1C", "MICROALBUR"  Lipid Panel  No results found for: "CHOL", "TRIG", "HDL", "CHOLHDL", "VLDL", "LDLCALC", "LDLDIRECT", "LABVLDL"   Lab Results  Component Value Date   TSH 0.411 (L) 03/13/2023   TSH 0.132 (L) 11/15/2022   TSH 4.200 09/07/2022   TSH 31.500 (H) 07/07/2022   TSH 33.300 (H) 05/30/2022   TSH 0.085 (L) 04/06/2022   TSH <0.005 (L) 01/04/2022   TSH 0.01 (A) 11/29/2021   FREET4 1.36 03/13/2023   FREET4 1.52 11/15/2022   FREET4 1.15 09/07/2022   FREET4 0.78 (L) 07/07/2022   FREET4 0.55 (L) 05/30/2022   FREET4 0.87 04/06/2022   FREET4 1.75 01/04/2022     TSH TSH Resulted: 11/29/21 0000  Result status: Final  Resulting lab: OTHER  Reference range: 0.41 - 5.90  Value: 0.01 Abnormal    Comment: TSH < 0.01; Free T4-1.41    Uptake and Scan from 01/14/22 CLINICAL DATA:  Symptomatic hyperthyroidism, TSH < 0.005   EXAM: THYROID  SCAN AND UPTAKE - 4 AND 24 HOURS   TECHNIQUE: Following oral administration of I-123 capsule, anterior planar imaging was acquired at 24 hours. Thyroid uptake was calculated with a thyroid probe at 4-6 hours and 24 hours.   RADIOPHARMACEUTICALS:  300 uCi I-123 sodium iodide p.o.   COMPARISON:  None   FINDINGS: Small foci of increased and decreased tracer uptake in both thyroid lobes consistent with a multinodular thyroid gland.   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     Electronically Signed   By: Ulyses Southward M.D.   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  She had RAI ablation on 02/28/22.  Her previsit thyroid function tests are consistent with appropriate hormone replacement.  Her symptoms keep fluctuating, having good and bad days.  Her main concern this visit is the excessive sweating/heat intolerance.  Will try changing her to branded Synthroid at 112 mcg po daily before breakfast to see if she tolerates that formulation better. Her weight based maximum thyroid hormone dosage should be around 128 mcg.   - The correct intake of thyroid hormone (Levothyroxine, Synthroid), is on empty stomach first thing in the morning, with water, separated by at least 30 minutes from breakfast and other medications,  and separated by more than 4 hours from calcium, iron,  multivitamins, acid reflux medications (PPIs).  - This medication is a life-long medication and will be needed to correct thyroid hormone imbalances for the rest of your life.  The dose may change from time to time, based on thyroid blood work.  - It is extremely important to be consistent taking this medication, near the same time each morning.  -AVOID TAKING PRODUCTS CONTAINING BIOTIN (commonly found in Hair, Skin, Nails vitamins) AS IT INTERFERES WITH THE VALIDITY OF THYROID FUNCTION BLOOD TESTS.    She has started implementing WFPB lifestyle changes, eats meat maybe twice per week and maintains active lifestyle with walking 5 days per week.      -Patient is advised to maintain close follow up with Gwendolyn Chime, FNP for primary care needs.    I spent  38  minutes in the care of the patient today including review of labs from Thyroid Function, CMP, and other relevant labs ; imaging/biopsy records (current and previous including abstractions from other facilities); face-to-face time discussing  her lab results and symptoms, medications doses, her options of short and long term treatment based on the latest standards of care / guidelines;   and documenting the encounter.  Gwendolyn Gordon  participated in the discussions, expressed understanding, and voiced agreement with the above plans.  All questions were answered to her satisfaction. she is encouraged to contact clinic should she have any questions or concerns prior to her return visit.  Follow up plan: Return in about 2 months (around 05/28/2023) for Thyroid follow up, Previsit labs.   Thank you for involving me in the care of this pleasant patient, and I will continue to update you with her progress.   Ronny Bacon, [facility] [address] [place], [place] [address] Phone: [phone] Fax: [phone]  03/27/2023, 1:23 PM

## 2023-05-22 ENCOUNTER — Telehealth: Payer: Self-pay | Admitting: *Deleted

## 2023-05-22 NOTE — Telephone Encounter (Signed)
Patient left a message that she was to have appointment 05/31/2023. When she was last here she was given a new medication , then she was to follow up with labs 8 weeks afterwards. Per the patient , they would not fill the new prescription until after she had completed her current prescription.  She states that she will call our office back for another appointment when she has started the new medication. If anyone needs to call her , [phone].

## 2023-05-31 ENCOUNTER — Ambulatory Visit: Payer: Medicare PPO | Admitting: Nurse Practitioner

## 2023-06-03 ENCOUNTER — Other Ambulatory Visit: Payer: Self-pay | Admitting: Nurse Practitioner

## 2023-08-08 LAB — TSH: TSH: 1.97 u[IU]/mL (ref 0.450–4.500)

## 2023-08-08 LAB — T4, FREE: Free T4: 1.21 ng/dL (ref 0.82–1.77)

## 2023-08-15 NOTE — Patient Instructions (Signed)

## 2023-08-16 ENCOUNTER — Encounter: Payer: Self-pay | Admitting: Nurse Practitioner

## 2023-08-16 ENCOUNTER — Ambulatory Visit: Payer: Medicare PPO | Admitting: Nurse Practitioner

## 2023-08-16 VITALS — BP 134/76 | HR 65 | Ht 64.0 in | Wt 179.8 lb

## 2023-08-16 DIAGNOSIS — E890 Postprocedural hypothyroidism: Secondary | ICD-10-CM

## 2023-08-16 MED ORDER — SYNTHROID 112 MCG PO TABS: 112.0000 ug | ORAL_TABLET | Freq: Every day | ORAL | 1 refills | Status: DC

## 2023-08-16 NOTE — Progress Notes (Signed)
08/16/2023     Endocrinology Follow Up Note    Subjective:    Patient ID: Gwendolyn Gordon, female    DOB: July 16, 1951, PCP Lenoria Chime, FNP.   Past Medical History:  Diagnosis Date   Allergies    Ganglion cyst    Vitamin D deficiency     Past Surgical History:  Procedure Laterality Date   APPENDECTOMY     PARTIAL HYSTERECTOMY      Social History   Socioeconomic History   Marital status: Widowed    Spouse name: Not on file   Number of children: Not on file   Years of education: Not on file   Highest education level: Not on file  Occupational History   Not on file  Tobacco Use   Smoking status: Never   Smokeless tobacco: Never  Vaping Use   Vaping status: Never Used  Substance and Sexual Activity   Alcohol use: Never   Drug use: Never   Sexual activity: Not on file  Other Topics Concern   Not on file  Social History Narrative   Not on file   Social Determinants of Health   Financial Resource Strain: Not on file  Food Insecurity: Not on file  Transportation Needs: Not on file  Physical Activity: Not on file  Stress: Not on file  Social Connections: Not on file    Family History  Problem Relation Age of Onset   Diabetes Mother     Outpatient Encounter Medications as of 08/16/2023  Medication Sig   cetirizine (ZYRTEC ALLERGY) 10 MG tablet daily as needed.   Cholecalciferol 50 MCG (2000 UT) CAPS daily.   ibuprofen (ADVIL) 200 MG tablet Take 200 mg by mouth every 6 (six) hours as needed. Patient takes as needed   SYNTHROID 112 MCG tablet Take 1 tablet (112 mcg total) by mouth daily before breakfast.   [DISCONTINUED] levothyroxine (SYNTHROID) 112 MCG tablet TAKE 1 TABLET(112 MCG) BY MOUTH DAILY BEFORE BREAKFAST   No facility-administered encounter medications on file as of 08/16/2023.    ALLERGIES: No Known Allergies  VACCINATION STATUS: Immunization History  Administered Date(s) Administered   Moderna Sars-Covid-2 Vaccination  10/12/2019, 11/09/2019   PFIZER(Purple Top)SARS-COV-2 Vaccination 08/05/2020   Pfizer Covid-19 Vaccine Bivalent Booster 31yrs & up 06/08/2021     HPI  Gwendolyn Gordon is 72 y.o. female who presents today with a medical history as above. she is being seen in follow up after being seen in consultation for hyperthyroidism requested by Lenoria Chime, FNP.  She had her annual physical and thyroid labs were drawn and came back abnormal.  She does not report any specific symptoms of thyroid dysfunction other than difficulty losing weight, hair loss, and muscle aches.  her most recent thyroid labs revealed suppressed TSH of < 0.01 and normal Free T4 of 1.41 on 11/29/21.  She has never had any imaging of her thyroid in the past.  she denies dysphagia, choking, shortness of breath, no recent voice change.    she does family history of thyroid dysfunction in her daughter-hyperthyroidism, but denies family hx of thyroid cancer. she denies personal history of goiter. she is not on any anti-thyroid medications nor on any thyroid hormone supplements. Denies use of Biotin containing supplements.  she is willing to proceed with appropriate work up and therapy for thyrotoxicosis.   She is S/p RAI ablation on 02/28/22, is now on thyroid hormone repalcement.   Review of systems  Constitutional: + Minimally  fluctuating body weight,  current Body mass index is 30.86 kg/m. , no fatigue, no subjective hyperthermia, no subjective hypothermia Eyes: no blurry vision, no xerophthalmia ENT: no sore throat, no nodules palpated in throat, no dysphagia/odynophagia, no hoarseness Cardiovascular: no chest pain, no shortness of breath, no palpitations, no leg swelling Respiratory: no cough, no shortness of breath Gastrointestinal: no nausea/vomiting/diarrhea Musculoskeletal: no muscle/joint aches Skin: no rashes, no hyperemia Neurological: no tremors, no numbness, no tingling, no dizziness Psychiatric: no depression,  no anxiety   Objective:    BP 134/76 (BP Location: Right Arm, Patient Position: Sitting, Cuff Size: Large)   Pulse 65   Ht 5\' 4"  (1.626 m)   Wt 179 lb 12.8 oz (81.6 kg)   BMI 30.86 kg/m   Wt Readings from Last 3 Encounters:  08/16/23 179 lb 12.8 oz (81.6 kg)  03/27/23 177 lb 9.6 oz (80.6 kg)  11/22/22 181 lb (82.1 kg)     BP Readings from Last 3 Encounters:  08/16/23 134/76  03/27/23 104/69  11/22/22 119/77     Physical Exam- Limited  Constitutional:  Body mass index is 30.86 kg/m. , not in acute distress, normal state of mind Eyes:  EOMI, no exophthalmos Musculoskeletal: no gross deformities, strength intact in all four extremities, no gross restriction of joint movements Skin:  no rashes, no hyperemia Neurological: no tremor with outstretched hands   CMP  No results found for: "NA", "K", "CL", "CO2", "GLUCOSE", "BUN", "CREATININE", "CALCIUM", "PROT", "ALBUMIN", "AST", "ALT", "ALKPHOS", "BILITOT", "GFRNONAA", "GFRAA"   CBC No results found for: "WBC", "RBC", "HGB", "HCT", "PLT", "MCV", "MCH", "MCHC", "RDW", "LYMPHSABS", "MONOABS", "EOSABS", "BASOSABS"   Diabetic Labs (most recent): No results found for: "HGBA1C", "MICROALBUR"  Lipid Panel  No results found for: "CHOL", "TRIG", "HDL", "CHOLHDL", "VLDL", "LDLCALC", "LDLDIRECT", "LABVLDL"   Lab Results  Component Value Date   TSH 1.970 08/07/2023   TSH 0.411 (L) 03/13/2023   TSH 0.132 (L) 11/15/2022   TSH 4.200 09/07/2022   TSH 31.500 (H) 07/07/2022   TSH 33.300 (H) 05/30/2022   TSH 0.085 (L) 04/06/2022   TSH <0.005 (L) 01/04/2022   TSH 0.01 (A) 11/29/2021   FREET4 1.21 08/07/2023   FREET4 1.36 03/13/2023   FREET4 1.52 11/15/2022   FREET4 1.15 09/07/2022   FREET4 0.78 (L) 07/07/2022   FREET4 0.55 (L) 05/30/2022   FREET4 0.87 04/06/2022   FREET4 1.75 01/04/2022     TSH TSH Resulted: 11/29/21 0000  Result status: Final  Resulting lab: OTHER  Reference range: 0.41 - 5.90  Value: 0.01 Abnormal     Comment: TSH < 0.01; Free T4-1.41    Uptake and Scan from 01/14/22 CLINICAL DATA:  Symptomatic hyperthyroidism, TSH < 0.005   EXAM: THYROID SCAN AND UPTAKE - 4 AND 24 HOURS   TECHNIQUE: Following oral administration of I-123 capsule, anterior planar imaging was acquired at 24 hours. Thyroid uptake was calculated with a thyroid probe at 4-6 hours and 24 hours.   RADIOPHARMACEUTICALS:  300 uCi I-123 sodium iodide p.o.   COMPARISON:  None   FINDINGS: Small foci of increased and decreased tracer uptake in both thyroid lobes consistent with a multinodular thyroid gland.   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     Electronically Signed   By: Ulyses Southward M.D.   On: 01/14/2022  10:19   Latest Reference Range & Units 05/30/22 14:16 07/07/22 14:35 09/07/22 13:30 11/15/22 13:00 03/13/23 13:05 08/07/23 14:12  TSH 0.450 - 4.500 uIU/mL 33.300 (H) 31.500 (H) 4.200 0.132 (L) 0.411 (L) 1.970  T4,Free(Direct) 0.82 - 1.77 ng/dL 6.23 (L) 7.62 (L) 8.31 1.52 1.36 1.21  (H): Data is abnormally high (L): Data is abnormally low  Assessment & Plan:   1) Hypothyroidism s/p RAI ablation for Graves disease  she is being seen at a kind request of Lenoria Chime, FNP.  She had RAI ablation on 02/28/22.  Her previsit thyroid function tests are consistent with appropriate hormone replacement, she is tolerating the branded Synthroid better than the generic, generally feels much better overall.  She is advised to continue branded Synthroid 112 mcg po daily before breakfast.   - The correct intake of thyroid hormone (Levothyroxine, Synthroid), is on empty stomach first thing in the morning, with water, separated by at least 30 minutes from breakfast and other medications,  and separated by more than 4 hours  from calcium, iron, multivitamins, acid reflux medications (PPIs).  - This medication is a life-long medication and will be needed to correct thyroid hormone imbalances for the rest of your life.  The dose may change from time to time, based on thyroid blood work.  - It is extremely important to be consistent taking this medication, near the same time each morning.  -AVOID TAKING PRODUCTS CONTAINING BIOTIN (commonly found in Hair, Skin, Nails vitamins) AS IT INTERFERES WITH THE VALIDITY OF THYROID FUNCTION BLOOD TESTS.    She has started implementing WFPB lifestyle changes, eats meat maybe twice per week and maintains active lifestyle with walking 5 days per week.      -Patient is advised to maintain close follow up with Lenoria Chime, FNP for primary care needs.    I spent  12  minutes in the care of the patient today including review of labs from Thyroid Function, CMP, and other relevant labs ; imaging/biopsy records (current and previous including abstractions from other facilities); face-to-face time discussing  her lab results and symptoms, medications doses, her options of short and long term treatment based on the latest standards of care / guidelines;   and documenting the encounter.  Gwendolyn Gordon  participated in the discussions, expressed understanding, and voiced agreement with the above plans.  All questions were answered to her satisfaction. she is encouraged to contact clinic should she have any questions or concerns prior to her return visit.  Follow up plan: Return in about 3 months (around 11/14/2023) for Thyroid follow up, Previsit labs.   Thank you for involving me in the care of this pleasant patient, and I will continue to update you with her progress.   Ronny Bacon, [facility] [address] [place], [place] [address] Phone: [phone] Fax: [phone]  08/16/2023, 8:34 AM

## 2023-11-07 LAB — TSH: TSH: 2.54 u[IU]/mL (ref 0.450–4.500)

## 2023-11-07 LAB — T4, FREE: Free T4: 1.16 ng/dL (ref 0.82–1.77)

## 2023-11-13 NOTE — Patient Instructions (Signed)

## 2023-11-15 ENCOUNTER — Ambulatory Visit (INDEPENDENT_AMBULATORY_CARE_PROVIDER_SITE_OTHER): Payer: Medicare PPO | Admitting: Nurse Practitioner

## 2023-11-15 ENCOUNTER — Encounter: Payer: Self-pay | Admitting: Nurse Practitioner

## 2023-11-15 VITALS — BP 138/88 | HR 60 | Ht 64.0 in | Wt 180.0 lb

## 2023-11-15 DIAGNOSIS — E890 Postprocedural hypothyroidism: Secondary | ICD-10-CM | POA: Diagnosis not present

## 2023-11-15 MED ORDER — SYNTHROID 112 MCG PO TABS: 112.0000 ug | ORAL_TABLET | Freq: Every day | ORAL | 1 refills | Status: DC

## 2023-11-15 NOTE — Progress Notes (Signed)
 11/15/2023     Endocrinology Follow Up Note    Subjective:    Patient ID: Gwendolyn Gordon, female    DOB: 1951-08-29, PCP Lenoria Chime, FNP.   Past Medical History:  Diagnosis Date   Allergies    Ganglion cyst    Vitamin D deficiency     Past Surgical History:  Procedure Laterality Date   APPENDECTOMY     PARTIAL HYSTERECTOMY      Social History   Socioeconomic History   Marital status: Widowed    Spouse name: Not on file   Number of children: Not on file   Years of education: Not on file   Highest education level: Not on file  Occupational History   Not on file  Tobacco Use   Smoking status: Never   Smokeless tobacco: Never  Vaping Use   Vaping status: Never Used  Substance and Sexual Activity   Alcohol use: Never   Drug use: Never   Sexual activity: Not on file  Other Topics Concern   Not on file  Social History Narrative   Not on file   Social Drivers of Health   Financial Resource Strain: Not on file  Food Insecurity: Not on file  Transportation Needs: Not on file  Physical Activity: Not on file  Stress: Not on file  Social Connections: Not on file    Family History  Problem Relation Age of Onset   Diabetes Mother     Outpatient Encounter Medications as of 11/15/2023  Medication Sig   cetirizine (ZYRTEC ALLERGY) 10 MG tablet daily as needed.   Cholecalciferol 50 MCG (2000 UT) CAPS daily.   ibuprofen (ADVIL) 200 MG tablet Take 200 mg by mouth every 6 (six) hours as needed. Patient takes as needed   [DISCONTINUED] SYNTHROID 112 MCG tablet Take 1 tablet (112 mcg total) by mouth daily before breakfast.   SYNTHROID 112 MCG tablet Take 1 tablet (112 mcg total) by mouth daily before breakfast.   No facility-administered encounter medications on file as of 11/15/2023.    ALLERGIES: No Known Allergies  VACCINATION STATUS: Immunization History  Administered Date(s) Administered   Moderna Sars-Covid-2 Vaccination 10/12/2019,  11/09/2019   PFIZER(Purple Top)SARS-COV-2 Vaccination 08/05/2020   Pfizer Covid-19 Vaccine Bivalent Booster 41yrs & up 06/08/2021     HPI  SANNA PORCARO is 73 y.o. female who presents today with a medical history as above. she is being seen in follow up after being seen in consultation for hyperthyroidism requested by Lenoria Chime, FNP.  She had her annual physical and thyroid labs were drawn and came back abnormal.  She does not report any specific symptoms of thyroid dysfunction other than difficulty losing weight, hair loss, and muscle aches.  her most recent thyroid labs revealed suppressed TSH of < 0.01 and normal Free T4 of 1.41 on 11/29/21.  She has never had any imaging of her thyroid in the past.  she denies dysphagia, choking, shortness of breath, no recent voice change.    she does family history of thyroid dysfunction in her daughter-hyperthyroidism, but denies family hx of thyroid cancer. she denies personal history of goiter. she is not on any anti-thyroid medications nor on any thyroid hormone supplements. Denies use of Biotin containing supplements.  she is willing to proceed with appropriate work up and therapy for thyrotoxicosis.   She is S/p RAI ablation on 02/28/22, is now on thyroid hormone repalcement.   Review of systems  Constitutional: +  Minimally fluctuating body weight,  current Body mass index is 30.9 kg/m. , no fatigue, no subjective hyperthermia, no subjective hypothermia Eyes: no blurry vision, no xerophthalmia ENT: no sore throat, no nodules palpated in throat, no dysphagia/odynophagia, no hoarseness Cardiovascular: no chest pain, no shortness of breath, no palpitations, no leg swelling Respiratory: no cough, no shortness of breath Gastrointestinal: no nausea/vomiting/diarrhea Musculoskeletal: no muscle/joint aches Skin: no rashes, no hyperemia Neurological: no tremors, no numbness, no tingling, no dizziness Psychiatric: no depression, no  anxiety   Objective:    BP 138/88 (BP Location: Left Arm, Patient Position: Sitting, Cuff Size: Large)   Pulse 60   Ht 5\' 4"  (1.626 m)   Wt 180 lb (81.6 kg)   BMI 30.90 kg/m   Wt Readings from Last 3 Encounters:  11/15/23 180 lb (81.6 kg)  08/16/23 179 lb 12.8 oz (81.6 kg)  03/27/23 177 lb 9.6 oz (80.6 kg)     BP Readings from Last 3 Encounters:  11/15/23 138/88  08/16/23 134/76  03/27/23 104/69     Physical Exam- Limited  Constitutional:  Body mass index is 30.9 kg/m. , not in acute distress, normal state of mind Eyes:  EOMI, no exophthalmos Musculoskeletal: no gross deformities, strength intact in all four extremities, no gross restriction of joint movements Skin:  no rashes, no hyperemia Neurological: no tremor with outstretched hands   CMP  No results found for: "NA", "K", "CL", "CO2", "GLUCOSE", "BUN", "CREATININE", "CALCIUM", "PROT", "ALBUMIN", "AST", "ALT", "ALKPHOS", "BILITOT", "GFRNONAA", "GFRAA"   CBC No results found for: "WBC", "RBC", "HGB", "HCT", "PLT", "MCV", "MCH", "MCHC", "RDW", "LYMPHSABS", "MONOABS", "EOSABS", "BASOSABS"   Diabetic Labs (most recent): No results found for: "HGBA1C", "MICROALBUR"  Lipid Panel  No results found for: "CHOL", "TRIG", "HDL", "CHOLHDL", "VLDL", "LDLCALC", "LDLDIRECT", "LABVLDL"   Lab Results  Component Value Date   TSH 2.540 11/06/2023   TSH 1.970 08/07/2023   TSH 0.411 (L) 03/13/2023   TSH 0.132 (L) 11/15/2022   TSH 4.200 09/07/2022   TSH 31.500 (H) 07/07/2022   TSH 33.300 (H) 05/30/2022   TSH 0.085 (L) 04/06/2022   TSH <0.005 (L) 01/04/2022   TSH 0.01 (A) 11/29/2021   FREET4 1.16 11/06/2023   FREET4 1.21 08/07/2023   FREET4 1.36 03/13/2023   FREET4 1.52 11/15/2022   FREET4 1.15 09/07/2022   FREET4 0.78 (L) 07/07/2022   FREET4 0.55 (L) 05/30/2022   FREET4 0.87 04/06/2022   FREET4 1.75 01/04/2022     TSH TSH Resulted: 11/29/21 0000  Result status: Final  Resulting lab: OTHER  Reference range:  0.41 - 5.90  Value: 0.01 Abnormal    Comment: TSH < 0.01; Free T4-1.41    Uptake and Scan from 01/14/22 CLINICAL DATA:  Symptomatic hyperthyroidism, TSH < 0.005   EXAM: THYROID SCAN AND UPTAKE - 4 AND 24 HOURS   TECHNIQUE: Following oral administration of I-123 capsule, anterior planar imaging was acquired at 24 hours. Thyroid uptake was calculated with a thyroid probe at 4-6 hours and 24 hours.   RADIOPHARMACEUTICALS:  300 uCi I-123 sodium iodide p.o.   COMPARISON:  None   FINDINGS: Small foci of increased and decreased tracer uptake in both thyroid lobes consistent with a multinodular thyroid gland.   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     Electronically Signed  By: Ulyses Southward M.D.   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  She had RAI ablation on 02/28/22.  Her previsit thyroid function tests are consistent with appropriate hormone replacement, she is tolerating the branded Synthroid better than the generic, generally feels much better overall.  She is advised to continue branded Synthroid 112 mcg po daily before breakfast.   - The correct intake of thyroid hormone (Levothyroxine, Synthroid), is on empty stomach first thing in the morning, with water, separated by at least 30 minutes from breakfast and other medications,  and  separated by more than 4 hours from calcium, iron, multivitamins, acid reflux medications (PPIs).  - This medication is a life-long medication and will be needed to correct thyroid hormone imbalances for the rest of your life.  The dose may change from time to time, based on thyroid blood work.  - It is extremely important to be consistent taking this medication, near the same time each morning.  -AVOID TAKING PRODUCTS CONTAINING BIOTIN (commonly found in Hair, Skin, Nails vitamins) AS IT INTERFERES WITH THE VALIDITY OF THYROID FUNCTION BLOOD TESTS.    She has started implementing WFP[employer] lifestyle changes, eats meat maybe twice per week and maintains active lifestyle with walking 5 days per week.      -Patient is advised to maintain close follow up with Lenoria Chime, FNP for primary care needs.     I spent  12  minutes in the care of the patient today including review of labs from Thyroid Function, CMP, and other relevant labs ; imaging/biopsy records (current and previous including abstractions from other facilities); face-to-face time discussing  her lab results and symptoms, medications doses, her options of short and long term treatment based on the latest standards of care / guidelines;   and documenting the encounter.  Gwendolyn Gordon  participated in the discussions, expressed understanding, and voiced agreement with the above plans.  All questions were answered to her satisfaction. she is encouraged to contact clinic should she have any questions or concerns prior to her return visit.  Follow up plan: Return in about 4 months (around 03/16/2024) for Thyroid follow up, Previsit labs.   Thank you for involving me in the care of this pleasant patient, and I will continue to update you with her progress.  Ronny Bacon, [facility] [address] [place], [place] [address] Phone: [phone] Fax: [phone]  11/15/2023, 8:01 AM

## 2024-02-10 IMAGING — NM NM THYROID IMAGING W/ UPTAKE MULTI (4&24 HR)
4 series · 4 of 4 positions shown · non-contrast
Comparison: None

CLINICAL DATA: Symptomatic hyperthyroidism, TSH <

EXAM:
THYROID SCAN AND UPTAKE - 4 AND 24 HOURS
TECHNIQUE: Following oral administration of F-01J capsule, anterior planar
imaging was acquired at 24 hours. Thyroid uptake was calculated with
a thyroid probe at 4-6 hours and 24 hours.
RADIOPHARMACEUTICALS:  300 uCi F-01J sodium iodide p.o.

[Series 1: anterior · 1.18mm/px · 1 of 1 slices shown]
[im 1/1]
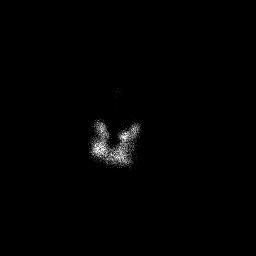

[Series 2: ant w marker · 1.18mm/px · 1 of 1 slices shown]
[im 1/1]
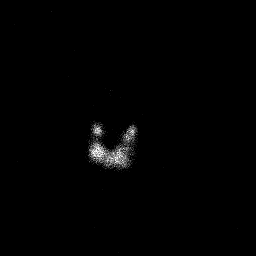

[Series 3: lao · 1.18mm/px · 1 of 1 slices shown]
[im 1/1]
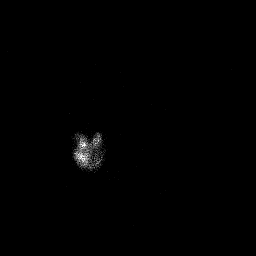

[Series 4: rao · 1.18mm/px · 1 of 1 slices shown]
[im 1/1]
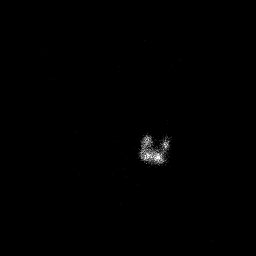

[4 of 4 positions shown; findings below may reference images not displayed]

FINDINGS: Small foci of increased and decreased tracer uptake in both thyroid
lobes consistent with a multinodular thyroid gland.

4 hour F-01J uptake = 21.8% (normal 5-20%)

24 hour F-01J uptake = 36.1% (normal 10-30%)
IMPRESSION: Elevated 4 hour and 24 hour radio iodine uptakes consistent with
hyperthyroidism with multinodular thyroid gland on scintigraphy.

## 2024-03-12 LAB — TSH: TSH: 1.05 u[IU]/mL (ref 0.450–4.500)

## 2024-03-12 LAB — T4, FREE: Free T4: 1.35 ng/dL (ref 0.82–1.77)

## 2024-03-15 NOTE — Patient Instructions (Signed)

## 2024-03-18 ENCOUNTER — Encounter: Payer: Self-pay | Admitting: Nurse Practitioner

## 2024-03-18 ENCOUNTER — Ambulatory Visit: Admitting: Nurse Practitioner

## 2024-03-18 VITALS — BP 112/86 | HR 83 | Ht 64.0 in | Wt 169.4 lb

## 2024-03-18 DIAGNOSIS — E890 Postprocedural hypothyroidism: Secondary | ICD-10-CM

## 2024-03-18 MED ORDER — SYNTHROID 112 MCG PO TABS: 112.0000 ug | ORAL_TABLET | Freq: Every day | ORAL | 1 refills | Status: DC

## 2024-03-18 NOTE — Progress Notes (Signed)
 03/18/2024     Endocrinology Follow Up Note    Subjective:    Patient ID: Gwendolyn Gordon, female    DOB: March 21, 1951, PCP Gwendolyn Berwyn LABOR, FNP.   Past Medical History:  Diagnosis Date   Allergies    Ganglion cyst    Vitamin D deficiency     Past Surgical History:  Procedure Laterality Date   APPENDECTOMY     PARTIAL HYSTERECTOMY      Social History   Socioeconomic History   Marital status: Widowed    Spouse name: Not on file   Number of children: Not on file   Years of education: Not on file   Highest education level: Not on file  Occupational History   Not on file  Tobacco Use   Smoking status: Never   Smokeless tobacco: Never  Vaping Use   Vaping status: Never Used  Substance and Sexual Activity   Alcohol use: Never   Drug use: Never   Sexual activity: Not on file  Other Topics Concern   Not on file  Social History Narrative   Not on file   Social Drivers of Health   Financial Resource Strain: Not on file  Food Insecurity: Not on file  Transportation Needs: Not on file  Physical Activity: Not on file  Stress: Not on file  Social Connections: Not on file    Family History  Problem Relation Age of Onset   Diabetes Mother     Outpatient Encounter Medications as of 03/18/2024  Medication Sig   cetirizine (ZYRTEC ALLERGY) 10 MG tablet daily as needed.   Cholecalciferol 50 MCG (2000 UT) CAPS daily.   ibuprofen (ADVIL) 200 MG tablet Take 200 mg by mouth every 6 (six) hours as needed. Patient takes as needed   Multiple Vitamins-Minerals (CENTRUM SILVER 50+WOMEN PO) Take by mouth daily.   [DISCONTINUED] SYNTHROID  112 MCG tablet Take 1 tablet (112 mcg total) by mouth daily before breakfast.   SYNTHROID  112 MCG tablet Take 1 tablet (112 mcg total) by mouth daily before breakfast.   [DISCONTINUED] SYNTHROID  112 MCG tablet Take 1 tablet (112 mcg total) by mouth daily before breakfast.   No facility-administered encounter medications on file as  of 03/18/2024.    ALLERGIES: No Known Allergies  VACCINATION STATUS: Immunization History  Administered Date(s) Administered   Moderna Sars-Covid-2 Vaccination 10/12/2019, 11/09/2019   PFIZER(Purple Top)SARS-COV-2 Vaccination 08/05/2020   Pfizer Covid-19 Vaccine Bivalent Booster 53yrs & up 06/08/2021     HPI  Gwendolyn Gordon is 73 y.o. female who presents today with a medical history as above. she is being seen in follow up after being seen in consultation for hyperthyroidism requested by Gwendolyn Berwyn LABOR, FNP.  She had her annual physical and thyroid  labs were drawn and came back abnormal.  She does not report any specific symptoms of thyroid  dysfunction other than difficulty losing weight, hair loss, and muscle aches.  her most recent thyroid  labs revealed suppressed TSH of < 0.01 and normal Free T4 of 1.41 on 11/29/21.  She has never had any imaging of her thyroid  in the past.  she denies dysphagia, choking, shortness of breath, no recent voice change.    she does family history of thyroid  dysfunction in her daughter-hyperthyroidism, but denies family hx of thyroid  cancer. she denies personal history of goiter. she is not on any anti-thyroid  medications nor on any thyroid  hormone supplements. Denies use of Biotin containing supplements.  she is willing to proceed with  appropriate work up and therapy for thyrotoxicosis.   She is S/p RAI ablation on 02/28/22, is now on thyroid  hormone replacement.   Review of systems  Constitutional: +decreasing body weight,  current Body mass index is 29.08 kg/m. , no fatigue, no subjective hyperthermia, no subjective hypothermia Eyes: no blurry vision, no xerophthalmia ENT: no sore throat, no nodules palpated in throat, no dysphagia/odynophagia, no hoarseness Cardiovascular: no chest pain, no shortness of breath, no palpitations, no leg swelling Respiratory: no cough, no shortness of breath Gastrointestinal: no  nausea/vomiting/diarrhea Musculoskeletal: no muscle/joint aches Skin: no rashes, no hyperemia Neurological: no tremors, no numbness, no tingling, no dizziness Psychiatric: no depression, no anxiety   Objective:    BP 112/86 (BP Location: Left Arm, Patient Position: Sitting, Cuff Size: Large)   Pulse 83   Ht 5' 4 (1.626 m)   Wt 169 lb 6.4 oz (76.8 kg)   BMI 29.08 kg/m   Wt Readings from Last 3 Encounters:  03/18/24 169 lb 6.4 oz (76.8 kg)  11/15/23 180 lb (81.6 kg)  08/16/23 179 lb 12.8 oz (81.6 kg)     BP Readings from Last 3 Encounters:  03/18/24 112/86  11/15/23 138/88  08/16/23 134/76      Physical Exam- Limited  Constitutional:  Body mass index is 29.08 kg/m. , not in acute distress, normal state of mind Eyes:  EOMI, no exophthalmos Musculoskeletal: no gross deformities, strength intact in all four extremities, no gross restriction of joint movements Skin:  no rashes, no hyperemia Neurological: no tremor with outstretched hands   CMP  No results found for: NA, K, CL, CO2, GLUCOSE, BUN, CREATININE, CALCIUM, PROT, ALBUMIN, AST, ALT, ALKPHOS, BILITOT, GFRNONAA, GFRAA   CBC No results found for: WBC, RBC, HGB, HCT, PLT, MCV, MCH, MCHC, RDW, LYMPHSABS, MONOABS, EOSABS, BASOSABS   Diabetic Labs (most recent): No results found for: HGBA1C, MICROALBUR  Lipid Panel  No results found for: CHOL, TRIG, HDL, CHOLHDL, VLDL, LDLCALC, LDLDIRECT, LABVLDL   Lab Results  Component Value Date   TSH 1.050 03/11/2024   TSH 2.540 11/06/2023   TSH 1.970 08/07/2023   TSH 0.411 (L) 03/13/2023   TSH 0.132 (L) 11/15/2022   TSH 4.200 09/07/2022   TSH 31.500 (H) 07/07/2022   TSH 33.300 (H) 05/30/2022   TSH 0.085 (L) 04/06/2022   TSH <0.005 (L) 01/04/2022   FREET4 1.35 03/11/2024   FREET4 1.16 11/06/2023   FREET4 1.21 08/07/2023   FREET4 1.36 03/13/2023   FREET4 1.52 11/15/2022   FREET4 1.15  09/07/2022   FREET4 0.78 (L) 07/07/2022   FREET4 0.55 (L) 05/30/2022   FREET4 0.87 04/06/2022   FREET4 1.75 01/04/2022     Uptake and Scan from 01/14/22 CLINICAL DATA:  Symptomatic hyperthyroidism, TSH < 0.005   EXAM: THYROID  SCAN AND UPTAKE - 4 AND 24 HOURS   TECHNIQUE: Following oral administration of I-123 capsule, anterior planar imaging was acquired at 24 hours. Thyroid  uptake was calculated with a thyroid  probe at 4-6 hours and 24 hours.   RADIOPHARMACEUTICALS:  300 uCi I-123 sodium iodide p.o.   COMPARISON:  None   FINDINGS: Small foci of increased and decreased tracer uptake in both thyroid  lobes consistent with a multinodular thyroid  gland.   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     Electronically Signed  By: Oneil Kiss M.D.   On: 01/14/2022 10:19   Latest Reference Range & Units 11/15/22 13:00 03/13/23 13:05 08/07/23 14:12 11/06/23 13:36 03/11/24 13:05  TSH 0.450 - 4.500 uIU/mL 0.132 (L) 0.411 (L) 1.970 2.540 1.050  T4,Free(Direct) 0.82 - 1.77 ng/dL 8.47 8.63 8.78 8.83 8.64  (L): Data is abnormally low  Assessment & Plan:   1) Hypothyroidism s/p RAI ablation for Graves disease  she is being seen at a kind request of Gwendolyn Berwyn LABOR, FNP.  She had RAI ablation on 02/28/22.  Her previsit thyroid  function tests are consistent with appropriate hormone replacement.  She notes she transferred pharmacies and noticed the medication she picked up last time was the Levothyroxine , not the branded Synthroid .  She notes she definitely tolerates the Synthroid  better.  She is advised to continue branded Synthroid  112 mcg po daily before breakfast.  I [facility] prescription to [facility] to see if it is cheaper.   - The correct intake of thyroid  hormone  (Levothyroxine , Synthroid ), is on empty stomach first thing in the morning, with water, separated by at least 30 minutes from breakfast and other medications,  and separated by more than 4 hours from calcium, iron, multivitamins, acid reflux medications (PPIs).  - This medication is a life-long medication and will be needed to correct thyroid  hormone imbalances for the rest of your life.  The dose may change from time to time, based on thyroid  blood work.  - It is extremely important to be consistent taking this medication, near the same time each morning.  -AVOID TAKING PRODUCTS CONTAINING BIOTIN (commonly found in Hair, Skin, Nails vitamins) AS IT INTERFERES WITH THE VALIDITY OF THYROID  FUNCTION BLOOD TESTS.  Will recheck labs in 4 months, if stable, may push out follow ups to 6 months.     -Patient is advised to maintain close follow up with Gwendolyn Berwyn LABOR, FNP for primary care needs.    I spent  22  minutes in the care of the patient today including review of labs from Thyroid  Function, CMP, and other relevant labs ; imaging/biopsy records (current and previous including abstractions from other facilities); face-to-face time discussing  her lab results and symptoms, medications doses, her options of short and long term treatment based on the latest standards of care / guidelines;   and documenting the encounter.  Gwendolyn Gordon  participated in the discussions, expressed understanding, and voiced agreement with the above plans.  All questions were answered to her satisfaction. she is encouraged to contact clinic should she have any questions or concerns prior to her return visit.  Follow up plan: Return in about 4 months (around 07/19/2024) for Thyroid  follow up, Previsit labs.   Thank you for involving me in the care of this pleasant patient, and I will continue to update you with her progress.  Benton Rio, [facility] [address] [place], [place] [address] Phone: [phone] Fax: [phone]  03/18/2024, 8:09 AM

## 2024-07-09 LAB — TSH: TSH: 2.56 u[IU]/mL (ref 0.450–4.500)

## 2024-07-09 LAB — T4, FREE: Free T4: 1.27 ng/dL (ref 0.82–1.77)

## 2024-07-17 NOTE — Patient Instructions (Signed)

## 2024-07-19 ENCOUNTER — Ambulatory Visit: Admitting: Nurse Practitioner

## 2024-07-19 ENCOUNTER — Encounter: Payer: Self-pay | Admitting: Nurse Practitioner

## 2024-07-19 VITALS — BP 136/82 | HR 67 | Ht 64.0 in | Wt 172.2 lb

## 2024-07-19 DIAGNOSIS — E890 Postprocedural hypothyroidism: Secondary | ICD-10-CM

## 2024-07-19 MED ORDER — SYNTHROID 112 MCG PO TABS: 112.0000 ug | ORAL_TABLET | Freq: Every day | ORAL | 3 refills | Status: AC

## 2024-07-19 NOTE — Progress Notes (Signed)
 07/19/2024     Endocrinology Follow Up Note    Subjective:    Patient ID: Gwendolyn Gordon, female    DOB: [birth year], PCP Gwendolyn Berwyn LABOR, FNP.   Past Medical History:  Diagnosis Date   Allergies    Ganglion cyst    Vitamin D deficiency     Past Surgical History:  Procedure Laterality Date   APPENDECTOMY     PARTIAL HYSTERECTOMY      Social History   Socioeconomic History   Marital status: Widowed    Spouse name: Not on file   Number of children: Not on file   Years of education: Not on file   Highest education level: Not on file  Occupational History   Not on file  Tobacco Use   Smoking status: Never   Smokeless tobacco: Never  Vaping Use   Vaping status: Never Used  Substance and Sexual Activity   Alcohol use: Never   Drug use: Never   Sexual activity: Not on file  Other Topics Concern   Not on file  Social History Narrative   Not on file   Social Drivers of Health   Financial Resource Strain: Not on file  Food Insecurity: Not on file  Transportation Needs: Not on file  Physical Activity: Not on file  Stress: Not on file  Social Connections: Not on file    Family History  Problem Relation Age of Onset   Diabetes Mother     Outpatient Encounter Medications as of 07/19/2024  Medication Sig   cetirizine (ZYRTEC ALLERGY) 10 MG tablet daily as needed.   Cholecalciferol 50 MCG (2000 UT) CAPS daily.   ibuprofen (ADVIL) 200 MG tablet Take 200 mg by mouth every 6 (six) hours as needed. Patient takes as needed   Multiple Vitamins-Minerals (CENTRUM SILVER 50+WOMEN PO) Take by mouth daily.   naproxen sodium (ALEVE) 220 MG tablet Take 220 mg by mouth every morning.   [DISCONTINUED] SYNTHROID  112 MCG tablet Take 1 tablet (112 mcg total) by mouth daily before breakfast.   SYNTHROID  112 MCG tablet Take 1 tablet (112 mcg total) by mouth daily before breakfast.   No facility-administered encounter medications on file as of 07/19/2024.     ALLERGIES: No Known Allergies  VACCINATION STATUS: Immunization History  Administered Date(s) Administered   Moderna Sars-Covid-2 Vaccination 10/12/2019, 11/09/2019   PFIZER(Purple Top)SARS-COV-2 Vaccination 08/05/2020   Pfizer Covid-19 Vaccine Bivalent Booster 35yrs & up 06/08/2021     HPI  Gwendolyn Gordon is 73 y.o. female who presents today with a medical history as above. she is being seen in follow up after being seen in consultation for hyperthyroidism requested by Gwendolyn Berwyn LABOR, FNP.  She had her annual physical and thyroid  labs were drawn and came back abnormal.  She does not report any specific symptoms of thyroid  dysfunction other than difficulty losing weight, hair loss, and muscle aches.  her most recent thyroid  labs revealed suppressed TSH of < 0.01 and normal Free T4 of 1.41 on 11/29/21.  She has never had any imaging of her thyroid  in the past.  she denies dysphagia, choking, shortness of breath, no recent voice change.    she does family history of thyroid  dysfunction in her daughter-hyperthyroidism, but denies family hx of thyroid  cancer. she denies personal history of goiter. she is not on any anti-thyroid  medications nor on any thyroid  hormone supplements. Denies use of Biotin containing supplements.  she is willing to proceed with appropriate work up  and therapy for thyrotoxicosis.   She is S/p RAI ablation on 02/28/22, is now on thyroid  hormone replacement.   Review of systems  Constitutional: + Minimally fluctuating body weight,  current Body mass index is 29.56 kg/m. , no fatigue, no subjective hyperthermia, no subjective hypothermia Eyes: no blurry vision, no xerophthalmia ENT: no sore throat, no nodules palpated in throat, no dysphagia/odynophagia, no hoarseness Cardiovascular: no chest pain, no shortness of breath, no palpitations, no leg swelling Respiratory: no cough, no shortness of breath Gastrointestinal: no  nausea/vomiting/diarrhea Musculoskeletal: no muscle/joint aches Skin: no rashes, no hyperemia Neurological: no tremors, no numbness, no tingling, no dizziness Psychiatric: no depression, no anxiety   Objective:    BP 136/82 (BP Location: Right Arm, Patient Position: Sitting) Comment: Retake BP. Patient asvised to monitor her BP and follow with PCP.  Pulse 67   Ht 5' 4 (1.626 m)   Wt 172 lb 3.2 oz (78.1 kg)   BMI 29.56 kg/m   Wt Readings from Last 3 Encounters:  07/19/24 172 lb 3.2 oz (78.1 kg)  03/18/24 169 lb 6.4 oz (76.8 kg)  11/15/23 180 lb (81.6 kg)     BP Readings from Last 3 Encounters:  07/19/24 136/82  03/18/24 112/86  11/15/23 138/88     Physical Exam- Limited  Constitutional:  Body mass index is 29.56 kg/m. , not in acute distress, normal state of mind Eyes:  EOMI, no exophthalmos Musculoskeletal: no gross deformities, strength intact in all four extremities, no gross restriction of joint movements Skin:  no rashes, no hyperemia Neurological: no tremor with outstretched hands   CMP  No results found for: NA, K, CL, CO2, GLUCOSE, BUN, CREATININE, CALCIUM, PROT, ALBUMIN, AST, ALT, ALKPHOS, BILITOT, GFRNONAA, GFRAA   CBC No results found for: WBC, RBC, HGB, HCT, PLT, MCV, MCH, MCHC, RDW, LYMPHSABS, MONOABS, EOSABS, BASOSABS   Diabetic Labs (most recent): No results found for: HGBA1C, MICROALBUR  Lipid Panel  No results found for: CHOL, TRIG, HDL, CHOLHDL, VLDL, LDLCALC, LDLDIRECT, LABVLDL   Lab Results  Component Value Date   TSH 2.560 07/08/2024   TSH 1.050 03/11/2024   TSH 2.540 11/06/2023   TSH 1.970 08/07/2023   TSH 0.411 (L) 03/13/2023   TSH 0.132 (L) 11/15/2022   TSH 4.200 09/07/2022   TSH 31.500 (H) 07/07/2022   TSH 33.300 (H) 05/30/2022   TSH 0.085 (L) 04/06/2022   FREET4 1.27 07/08/2024   FREET4 1.35 03/11/2024   FREET4 1.16 11/06/2023   FREET4 1.21  08/07/2023   FREET4 1.36 03/13/2023   FREET4 1.52 11/15/2022   FREET4 1.15 09/07/2022   FREET4 0.78 (L) 07/07/2022   FREET4 0.55 (L) 05/30/2022   FREET4 0.87 04/06/2022     Uptake and Scan from 01/14/22 CLINICAL DATA:  Symptomatic hyperthyroidism, TSH < 0.005   EXAM: THYROID  SCAN AND UPTAKE - 4 AND 24 HOURS   TECHNIQUE: Following oral administration of I-123 capsule, anterior planar imaging was acquired at 24 hours. Thyroid  uptake was calculated with a thyroid  probe at 4-6 hours and 24 hours.   RADIOPHARMACEUTICALS:  300 uCi I-123 sodium iodide p.o.   COMPARISON:  None   FINDINGS: Small foci of increased and decreased tracer uptake in both thyroid  lobes consistent with a multinodular thyroid  gland.   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  Electronically Signed   By: Oneil Kiss M.D.   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  She had RAI ablation on 02/28/22.  Her previsit thyroid  function tests are consistent with appropriate hormone replacement.  She is advised to continue Synthroid  112 mcg po daily before breakfast.  Will recheck TFTs prior to next visit and adjust dose accordingly.   - The correct intake of thyroid  hormone (Levothyroxine , Synthroid ), is on empty stomach first thing in the morning, with water, separated by at least 30 minutes from breakfast and other medications,  and separated by more than 4 hours from calcium, iron, multivitamins,  acid reflux medications (PPIs).  - This medication is a life-long medication and will be needed to correct thyroid  hormone imbalances for the rest of your life.  The dose may change from time to time, based on thyroid  blood work.  - It is extremely important to be consistent taking this medication, near the same time each morning.  -AVOID TAKING PRODUCTS CONTAINING BIOTIN (commonly found in Hair, Skin, Nails vitamins) AS IT INTERFERES WITH THE VALIDITY OF THYROID  FUNCTION BLOOD TESTS.       -Patient is advised to maintain close follow up with Gwendolyn Berwyn LABOR, FNP for primary care needs.     I spent  19  minutes in the care of the patient today including review of labs from Thyroid  Function, CMP, and other relevant labs ; imaging/biopsy records (current and previous including abstractions from other facilities); face-to-face time discussing  her lab results and symptoms, medications doses, her options of short and long term treatment based on the latest standards of care / guidelines;   and documenting the encounter.  Gwendolyn Gordon  participated in the discussions, expressed understanding, and voiced agreement with the above plans.  All questions were answered to her satisfaction. she is encouraged to contact clinic should she have any questions or concerns prior to her return visit.  Follow up plan: Return in about 6 months (around 01/16/2025) for Thyroid  follow up, Previsit labs.   Thank you for involving me in the care of this pleasant patient, and I will continue to update you with her progress.  Benton Rio, [facility] [address] [place], [place] [address] Phone: [phone] Fax: [phone]  07/19/2024, 8:13 AM

## 2025-01-16 ENCOUNTER — Ambulatory Visit: Admitting: Nurse Practitioner
